# Patient Record
Sex: Male | Born: 1965 | Race: White | Hispanic: No | Marital: Married | Smoking: Never smoker
Health system: Southern US, Community
[De-identification: ages and names within clinical notes are randomized; demographics above are authoritative.]

## PROBLEM LIST (undated history)

## (undated) DIAGNOSIS — I10 Essential (primary) hypertension: Secondary | ICD-10-CM

## (undated) HISTORY — DX: Essential (primary) hypertension: I10

---

## 2014-12-09 DIAGNOSIS — I10 Essential (primary) hypertension: Secondary | ICD-10-CM | POA: Insufficient documentation

## 2014-12-09 DIAGNOSIS — S39012A Strain of muscle, fascia and tendon of lower back, initial encounter: Secondary | ICD-10-CM | POA: Insufficient documentation

## 2015-05-20 DIAGNOSIS — M25562 Pain in left knee: Secondary | ICD-10-CM | POA: Insufficient documentation

## 2016-02-01 DIAGNOSIS — Z8739 Personal history of other diseases of the musculoskeletal system and connective tissue: Secondary | ICD-10-CM | POA: Insufficient documentation

## 2017-04-19 ENCOUNTER — Encounter: Payer: Self-pay | Admitting: Family Medicine

## 2017-04-19 ENCOUNTER — Ambulatory Visit (INDEPENDENT_AMBULATORY_CARE_PROVIDER_SITE_OTHER): Payer: BC Managed Care – PPO

## 2017-04-19 ENCOUNTER — Ambulatory Visit: Payer: BC Managed Care – PPO | Admitting: Family Medicine

## 2017-04-19 VITALS — BP 126/80 | HR 75 | Temp 99.0°F | Ht 71.0 in | Wt 233.1 lb

## 2017-04-19 DIAGNOSIS — I10 Essential (primary) hypertension: Secondary | ICD-10-CM | POA: Diagnosis not present

## 2017-04-19 DIAGNOSIS — S86911A Strain of unspecified muscle(s) and tendon(s) at lower leg level, right leg, initial encounter: Secondary | ICD-10-CM | POA: Diagnosis not present

## 2017-04-19 MED ORDER — INDOMETHACIN 50 MG PO CAPS: 50.0000 mg | ORAL_CAPSULE | Freq: Three times a day (TID) | ORAL | 0 refills | Status: DC | PRN

## 2017-04-19 MED ORDER — AMLODIPINE BESYLATE 10 MG PO TABS: 10.0000 mg | ORAL_TABLET | Freq: Every day | ORAL | 0 refills | Status: DC

## 2017-04-19 MED ORDER — TELMISARTAN-HCTZ 80-25 MG PO TABS: 1.0000 | ORAL_TABLET | Freq: Every day | ORAL | 0 refills | Status: DC

## 2017-04-19 NOTE — Progress Notes (Addendum)
Subjective:  Patient ID: Russell Franco, male    DOB: Mar 28, 1966  Age: 51 y.o. MRN: [id number]  CC: Establish Care   HPI Russell PlanKeith D Purpura presents for establishment of care and evaluation of right knee pain.  One week ago he experienced deceleration injury involving his right knee.  Right foot planted and his body kept going over it.  He has no history of right knee problems.  Distant history of gout.  He used an old prescription of Indocin for relief.  X-rays taken of his left knee in the last year showed also showed mild arthritic changes.  There is swelling in the knee that has improved with compression.  He has had no problems with locking or giving way.  BP is well controlled on his current blood pressure regimen.  Labs taken back in September were reviewed and were normal.  History Mellody DanceKeith has a past medical history of Hypertension.   He has no past surgical history on file.   His family history is not on file.He reports that  has never smoked. he has never used smokeless tobacco. His alcohol and drug histories are not on file.  Outpatient Medications Prior to Visit  Medication Sig Dispense Refill  . amLODipine (NORVASC) 10 MG tablet Take 1 tablet by mouth daily.    Marland Kitchen. telmisartan-hydrochlorothiazide (MICARDIS HCT) 80-25 MG tablet Take 1 tablet by mouth daily.     No facility-administered medications prior to visit.     ROS Review of Systems  Constitutional: Negative.   Respiratory: Negative for chest tightness and shortness of breath.   Cardiovascular: Negative for chest pain and palpitations.  Gastrointestinal: Negative for abdominal pain, nausea and vomiting.  Endocrine: Negative for polyphagia and polyuria.  Musculoskeletal: Positive for arthralgias and gait problem.  Skin: Negative for rash and wound.  Neurological: Negative for weakness.  Hematological: Does not bruise/bleed easily.  Psychiatric/Behavioral: Negative.     Objective:  BP 126/80 (BP Location: Left  Arm, Patient Position: Sitting, Cuff Size: Normal)   Pulse 75   Temp 99 F (37.2 C) (Oral)   Ht 5\' 11"  (1.803 m)   Wt 233 lb 2 oz (105.7 kg)   SpO2 98%   BMI 32.51 kg/m   Physical Exam  Constitutional: He is oriented to person, place, and time. He appears well-developed and well-nourished. No distress.  HENT:  Head: Normocephalic and atraumatic.  Right Ear: External ear normal.  Left Ear: External ear normal.  Eyes: Conjunctivae are normal. Right eye exhibits no discharge. Left eye exhibits no discharge. No scleral icterus.  Musculoskeletal:       Right knee: He exhibits swelling. He exhibits normal range of motion and no effusion. Tenderness found. Lateral joint line tenderness noted.       Legs: Neurological: He is alert and oriented to person, place, and time.  Skin: Skin is warm and dry. He is not diaphoretic.  Psychiatric: He has a normal mood and affect. His behavior is normal.      Assessment & Plan:   Mellody DanceKeith was seen today for establish care.  Diagnoses and all orders for this visit:  Strain of right knee, initial encounter -     DG Knee Complete 4 Views Right; Future -     Ambulatory referral to Sports Medicine -     indomethacin (INDOCIN) 50 MG capsule; Take 1 capsule (50 mg total) by mouth 3 (three) times daily as needed. With food. -     DG Knee Complete 4  Views Right  Essential hypertension -     amLODipine (NORVASC) 10 MG tablet; Take 1 tablet (10 mg total) by mouth daily. -     telmisartan-hydrochlorothiazide (MICARDIS HCT) 80-25 MG tablet; Take 1 tablet by mouth daily.   I have changed Lucio EdwardKeith D. Nash's amLODipine. I am also having him start on indomethacin. Additionally, I am having him maintain his telmisartan-hydrochlorothiazide.  Meds ordered this encounter  Medications  . indomethacin (INDOCIN) 50 MG capsule    Sig: Take 1 capsule (50 mg total) by mouth 3 (three) times daily as needed. With food.    Dispense:  60 capsule    Refill:  0  .  amLODipine (NORVASC) 10 MG tablet    Sig: Take 1 tablet (10 mg total) by mouth daily.    Dispense:  90 tablet    Refill:  0  . telmisartan-hydrochlorothiazide (MICARDIS HCT) 80-25 MG tablet    Sig: Take 1 tablet by mouth daily.    Dispense:  90 tablet    Refill:  0     Follow-up: No Follow-up on file.  Mliss SaxWilliam Alfred Oceane Fosse, MD

## 2017-04-19 NOTE — Addendum Note (Signed)
Addended by: Andrez GrimeKREMER, Traniya Prichett A on: 04/19/2017 03:54 PM   Modules accepted: Orders

## 2017-04-26 ENCOUNTER — Ambulatory Visit: Payer: BC Managed Care – PPO | Admitting: Family Medicine

## 2017-05-03 ENCOUNTER — Ambulatory Visit: Payer: BC Managed Care – PPO | Admitting: Family Medicine

## 2017-05-03 ENCOUNTER — Encounter: Payer: Self-pay | Admitting: Family Medicine

## 2017-05-03 DIAGNOSIS — S86911D Strain of unspecified muscle(s) and tendon(s) at lower leg level, right leg, subsequent encounter: Secondary | ICD-10-CM

## 2017-05-03 NOTE — Assessment & Plan Note (Signed)
Knee exam was reassuring today. There is no effusion to suggest an intra-articular problem. Hamstring testing was strongly as well. Ultrasound was reassuring and only had suggestion of a possible plantaris strain - Counseled on rehabilitation exercises - Continue compression and ice as needed. - Can follow-up when necessary.

## 2017-05-03 NOTE — Progress Notes (Signed)
Russell Franco - 52 y.o. male MRN [id number]  Date of birth: 07/31/1965  SUBJECTIVE:  Including CC & ROS.  Chief Complaint  Patient presents with  . Right knee pain    Russell Franco is a 52 y.o. male that is presenting with right knee pain. Patient states the pain has been ongoing for two weeks. Patient lost his balance walking in the snow on 04/07/17. He did not fall, but caught himself. He has been applying ice and using compression with improvement. Ambulating triggers more pain. Pain was located on the lateral aspect of his knee. Has been working out and feeling no significant pain. Pain is minimal and localized. No effusion.   Independent review of the right knee x-rays from 04/19/17 show good joint space and some suggestion of chondrocalcinosis.   Review of Systems  Constitutional: Negative for fever.  Respiratory: Negative for shortness of breath.   Cardiovascular: Negative for chest pain.  Gastrointestinal: Negative for abdominal pain.  Musculoskeletal: Negative for arthralgias, gait problem and joint swelling.  Skin: Negative for color change.  Neurological: Negative for weakness and numbness.  Hematological: Negative for adenopathy.  Psychiatric/Behavioral: Negative for agitation.    HISTORY: Past Medical, Surgical, Social, and Family History Reviewed & Updated per EMR.   Pertinent Historical Findings include:  Past Medical History:  Diagnosis Date  . Hypertension     No past surgical history on file.  Allergies  Allergen Reactions  . Penicillin G Other (See Comments) and Rash    Hives    No family history on file.   Social History   Socioeconomic History  . Marital status: Single    Spouse name: Not on file  . Number of children: Not on file  . Years of education: Not on file  . Highest education level: Not on file  Social Needs  . Financial resource strain: Not on file  . Food insecurity - worry: Not on file  . Food insecurity - inability: Not on  file  . Transportation needs - medical: Not on file  . Transportation needs - non-medical: Not on file  Occupational History  . Not on file  Tobacco Use  . Smoking status: Never Smoker  . Smokeless tobacco: Never Used  Substance and Sexual Activity  . Alcohol use: Not on file  . Drug use: Not on file  . Sexual activity: Not on file  Other Topics Concern  . Not on file  Social History Narrative  . Not on file     PHYSICAL EXAM:  VS: BP 134/76 (BP Location: Left Arm, Patient Position: Sitting, Cuff Size: Normal)   Pulse 80   Temp 98.8 F (37.1 C) (Oral)   Ht 5\' 11"  (1.803 m)   Wt 233 lb (105.7 kg)   SpO2 97%   BMI 32.50 kg/m  Physical Exam Gen: NAD, alert, cooperative with exam, well-appearing ENT: normal lips, normal nasal mucosa,  Eye: normal EOM, normal conjunctiva and lids CV:  no edema, +2 pedal pulses   Resp: no accessory muscle use, non-labored,  GI: no masses or tenderness, no hernia  Skin: no rashes, no areas of induration  Neuro: normal tone, normal sensation to touch Psych:  normal insight, alert and oriented MSK:  Right knee: No obvious effusion. Normal flexion and extension. Normal and points valgus and varus stress testing. Normal gait. Normal strength with hamstring testing was foot inverted and even her to. No tenderness to palpation of the hamstring. No obvious Baker's cyst. Neurovascular intact.  Limited ultrasound: Right knee:  No obvious effusion. Normal-appearing lateral meniscus. Normal-appearing LCL. No Baker's cyst. Normal-appearing lateral gastrocnemius. Normal insertion of the biceps femoris.  Plantaris appears to have hypoechoic change to demonstrate a strain.   Summary: Findings likely suggest of a plantaris strain.  Ultrasound and interpretation by Clare Gandy, MD        ASSESSMENT & PLAN:   Strain of right knee Knee exam was reassuring today. There is no effusion to suggest an intra-articular problem. Hamstring  testing was strongly as well. Ultrasound was reassuring and only had suggestion of a possible plantaris strain - Counseled on rehabilitation exercises - Continue compression and ice as needed. - Can follow-up when necessary.

## 2017-07-18 ENCOUNTER — Ambulatory Visit: Payer: BC Managed Care – PPO | Admitting: Family Medicine

## 2017-07-18 ENCOUNTER — Encounter: Payer: Self-pay | Admitting: Family Medicine

## 2017-07-18 VITALS — BP 130/70 | HR 77 | Wt 245.4 lb

## 2017-07-18 DIAGNOSIS — E6609 Other obesity due to excess calories: Secondary | ICD-10-CM | POA: Insufficient documentation

## 2017-07-18 DIAGNOSIS — M7662 Achilles tendinitis, left leg: Secondary | ICD-10-CM | POA: Insufficient documentation

## 2017-07-18 DIAGNOSIS — S86911A Strain of unspecified muscle(s) and tendon(s) at lower leg level, right leg, initial encounter: Secondary | ICD-10-CM

## 2017-07-18 DIAGNOSIS — M1000 Idiopathic gout, unspecified site: Secondary | ICD-10-CM | POA: Diagnosis not present

## 2017-07-18 DIAGNOSIS — Z6834 Body mass index (BMI) 34.0-34.9, adult: Secondary | ICD-10-CM | POA: Diagnosis not present

## 2017-07-18 DIAGNOSIS — Z0001 Encounter for general adult medical examination with abnormal findings: Secondary | ICD-10-CM | POA: Diagnosis not present

## 2017-07-18 DIAGNOSIS — I10 Essential (primary) hypertension: Secondary | ICD-10-CM | POA: Diagnosis not present

## 2017-07-18 DIAGNOSIS — Z125 Encounter for screening for malignant neoplasm of prostate: Secondary | ICD-10-CM | POA: Diagnosis not present

## 2017-07-18 MED ORDER — TELMISARTAN-HCTZ 80-25 MG PO TABS: 1.0000 | ORAL_TABLET | Freq: Every day | ORAL | 1 refills | Status: DC

## 2017-07-18 MED ORDER — INDOMETHACIN 50 MG PO CAPS: 50.0000 mg | ORAL_CAPSULE | Freq: Three times a day (TID) | ORAL | 0 refills | Status: DC | PRN

## 2017-07-18 MED ORDER — AMLODIPINE BESYLATE 10 MG PO TABS: 10.0000 mg | ORAL_TABLET | Freq: Every day | ORAL | 1 refills | Status: DC

## 2017-07-18 MED ORDER — COLCHICINE 0.6 MG PO TABS: 0.6000 mg | ORAL_TABLET | Freq: Two times a day (BID) | ORAL | 2 refills | Status: DC

## 2017-07-18 MED ORDER — LORCASERIN HCL 10 MG PO TABS: ORAL_TABLET | ORAL | 2 refills | Status: DC

## 2017-07-18 NOTE — Patient Instructions (Signed)
BMI for Adults Body mass index (BMI) is a number that is calculated from a person's weight and height. In most adults, the number is used to find how much of an adult's weight is made up of fat. BMI is not as accurate as a direct measure of body fat. How is BMI calculated? BMI is calculated by dividing weight in kilograms by height in meters squared. It can also be calculated by dividing weight in pounds by height in inches squared, then multiplying the resulting number by 703. Charts are available to help you find your BMI quickly and easily without doing this calculation. How is BMI interpreted? Health care professionals use BMI charts to identify whether an adult is underweight, at a normal weight, or overweight based on the following guidelines:  Underweight: BMI less than 18.5.  Normal weight: BMI between 18.5 and 24.9.  Overweight: BMI between 25 and 29.9.  Obese: BMI of 30 and above.  BMI is usually interpreted the same for males and females. Weight includes both fat and muscle, so someone with a muscular build, such as an athlete, may have a BMI that is higher than 24.9. In cases like these, BMI may not accurately depict body fat. To determine if excess body fat is the cause of a BMI of 25 or higher, further assessments may need to be done by a health care provider. Why is BMI a useful tool? BMI is used to identify a possible weight problem that may be related to a medical problem or may increase the risk for medical problems. BMI can also be used to promote changes to reach a healthy weight. This information is not intended to replace advice given to you by your health care provider. Make sure you discuss any questions you have with your health care provider. Document Released: 12/27/2003 Document Revised: 08/25/2015 Document Reviewed: 09/11/2013 Elsevier Interactive Patient Education  2018 [employer].  Achilles Tendinitis Rehab Ask your health care provider which exercises are  safe for you. Do exercises exactly as told by your health care provider and adjust them as directed. It is normal to feel mild stretching, pulling, tightness, or discomfort as you do these exercises, but you should stop right away if you feel sudden pain or your pain gets worse. Do not begin these exercises until told by your health care provider. Stretching and range of motion exercises These exercises warm up your muscles and joints and improve the movement and flexibility of your ankle. These exercises also help to relieve pain, numbness, and tingling. Exercise A: Standing wall calf stretch, knee straight  1. Stand with your hands against a wall. 2. Extend your __________ leg behind you and bend your front knee slightly. Keep both of your heels on the floor. 3. Point the toes of your back foot slightly inward. 4. Keeping your heels on the floor and your back knee straight, shift your weight toward the wall. Do not allow your back to arch. You should feel a gentle stretch in your calf. 5. Hold this position for seconds. Repeat __________ times. Complete this stretch __________ times per day. Exercise B: Standing wall calf stretch, knee bent 1. Stand with your hands against a wall. 2. Extend your __________ leg behind you, and bend your front knee slightly. Keep both of your heels on the floor. 3. Point the toes of your back foot slightly inward. 4. Keeping your heels on the floor, unlock your back knee so that it is bent. You should feel a  gentle stretch deep in your calf. 5. Hold this position for __________ seconds. Repeat __________ times. Complete this stretch __________ times per day. Strengthening exercises These exercises build strength and control of your ankle. Endurance is the ability to use your muscles for a long time, even after they get tired. Exercise C: Plantar flexion with band  1. Sit on the floor with your __________ leg extended. You may put a pillow under your calf to  give your foot more room to move. 2. Loop a rubber exercise band or tube around the ball of your __________ foot. The ball of your foot is on the walking surface, right under your toes. The band or tube should be slightly tense when your foot is relaxed. If the band or tube slips, you can put on your shoe or put a washcloth between the band and your foot to help it stay in place. 3. Slowly point your toes downward, pushing them away from you. 4. Hold this position for __________ seconds. 5. Slowly release the tension in the band or tube, controlling smoothly until your foot is back to the starting position. Repeat __________ times. Complete this exercise __________ times per day. Exercise D: Heel raise with eccentric lower  1. Stand on a step with the balls of your feet. The ball of your foot is on the walking surface, right under your toes. ? Do not put your heels on the step. ? For balance, rest your hands on the wall or on a railing. 2. Rise up onto the balls of your feet. 3. Keeping your heels up, shift all of your weight to your __________ leg and pick up your other leg. 4. Slowly lower your __________ leg so your heel drops below the level of the step. 5. Put down your foot. If told by your health care provider, build up to:  3 sets of 15 repetitions while keeping your knees straight.  3 sets of 15 repetitions while keeping your knees bent as far as told by your health care provider.  Complete this exercise __________ times per day. If this exercise is too easy, try doing it while wearing a backpack with weights in it. Balance exercises These exercises improve or maintain your balance. Balance is important in preventing falls. Exercise E: Single leg stand 1. Without shoes, stand near a railing or in a door frame. Hold on to the railing or door frame as needed. 2. Stand on your __________ foot. Keep your big toe down on the floor and try to keep your arch lifted. 3. Hold this  position for __________ seconds. Repeat __________ times. Complete this exercise __________ times per day. If this exercise is too easy, you can try it with your eyes closed or while standing on a pillow. This information is not intended to replace advice given to you by your health care provider. Make sure you discuss any questions you have with your health care provider. Document Released: 11/15/2004 Document Revised: 12/22/2015 Document Reviewed: 12/21/2014 Elsevier Interactive Patient Education  2018 [employer]  Achilles Tendinitis Achilles tendinitis is inflammation of the tough, cord-like band that attaches the lower leg muscles to the heel bone (Achilles tendon). This is usually caused by overusing the tendon and the ankle joint. Achilles tendinitis usually gets better over time with treatment and caring for yourself at home. It can take weeks or months to heal completely. What are the causes? This condition may be caused by:  A sudden increase in exercise or activity,  such as running.  Doing the same exercises or activities (such as jumping) over and over.  Not warming up calf muscles before exercising.  Exercising in shoes that are worn out or not made for exercise.  Having arthritis or a bone growth (spur) on the back of the heel bone. This can rub against the tendon and hurt it.  Age-related wear and tear. Tendons become less flexible with age and more likely to be injured.  What are the signs or symptoms? Common symptoms of this condition include:  Pain in the Achilles tendon or in the back of the leg, just above the heel. The pain usually gets worse with exercise.  Stiffness or soreness in the back of the leg, especially in the morning.  Swelling of the skin over the Achilles tendon.  Thickening of the tendon.  Bone spurs at the bottom of the Achilles tendon, near the heel.  Trouble standing on tiptoe.  How is this diagnosed? This condition is diagnosed based  on your symptoms and a physical exam. You may have tests, including:  X-rays.  MRI.  How is this treated? The goal of treatment is to relieve symptoms and help your injury heal. Treatment may include:  Decreasing or stopping activities that caused the tendinitis. This may mean switching to low-impact exercises like biking or swimming.  Icing the injured area.  Doing physical therapy, including strengthening and stretching exercises.  NSAIDs to help relieve pain and swelling.  Using supportive shoes, wraps, heel lifts, or a walking boot (air cast).  Surgery. This may be done if your symptoms do not improve after 6 months.  Using high-energy shock wave impulses to stimulate the healing process (extracorporeal shock wave therapy). This is rare.  Injection of medicines to help relieve inflammation (corticosteroids). This is rare.  Follow these instructions at home: If you have an air cast:  Wear the cast as told by your health care provider. Remove it only as told by your health care provider.  Loosen the cast if your toes tingle, become numb, or turn cold and blue. Activity  Gradually return to your normal activities once your health care provider approves. Do not do activities that cause pain. ? Consider doing low-impact exercises, like cycling or swimming.  If you have an air cast, ask your health care provider when it is safe for you to drive.  If physical therapy was prescribed, do exercises as told by your health care provider or physical therapist. Managing pain, stiffness, and swelling  Raise (elevate) your foot above the level of your heart while you are sitting or lying down.  Move your toes often to avoid stiffness and to lessen swelling.  If directed, put ice on the injured area: ? Put ice in a plastic bag. ? Place a towel between your skin and the bag. ? Leave the ice on for 20 minutes, 2-3 times a day General instructions  If directed, wrap your foot with  an elastic bandage or other wrap. This can help keep your tendon from moving too much while it heals. Your health care provider will show you how to wrap your foot correctly.  Wear supportive shoes or heel lifts only as told by your health care provider.  Take over-the-counter and prescription medicines only as told by your health care provider.  Keep all follow-up visits as told by your health care provider. This is important. Contact a health care provider if:  You have symptoms that gets worse.  You have  pain that does not get better with medicine.  You develop new, unexplained symptoms.  You develop warmth and swelling in your foot.  You have a fever. Get help right away if:  You have a sudden popping sound or sensation in your Achilles tendon followed by severe pain.  You cannot move your toes or foot.  You cannot put any weight on your foot. Summary  Achilles tendinitis is inflammation of the tough, cord-like band that attaches the lower leg muscles to the heel bone (Achilles tendon).  This condition is usually caused by overusing the tendon and the ankle joint. It can also be caused by arthritis or normal aging.  The most common symptoms of this condition include pain, swelling, or stiffness in the Achilles tendon or in the back of the leg.  This condition is usually treated with rest, NSAIDs, and physical therapy. This information is not intended to replace advice given to you by your health care provider. Make sure you discuss any questions you have with your health care provider. Document Released: 01/24/2005 Document Revised: 03/05/2016 Document Reviewed: 03/05/2016 Elsevier Interactive Patient Education  2017 [employer]  Gout Gout is painful swelling that can occur in some of your joints. Gout is a type of arthritis. This condition is caused by having too much uric acid in your body. Uric acid is a chemical that forms when your body breaks down substances called  purines. Purines are important for building body proteins. When your body has too much uric acid, sharp crystals can form and build up inside your joints. This causes pain and swelling. Gout attacks can happen quickly and be very painful (acute gout). Over time, the attacks can affect more joints and become more frequent (chronic gout). Gout can also cause uric acid to build up under your skin and inside your kidneys. What are the causes? This condition is caused by too much uric acid in your blood. This can occur because:  Your kidneys do not remove enough uric acid from your blood. This is the most common cause.  Your body makes too much uric acid. This can occur with some cancers and cancer treatments. It can also occur if your body is breaking down too many red blood cells (hemolytic anemia).  You eat too many foods that are high in purines. These foods include organ meats and some seafood. Alcohol, especially beer, is also high in purines.  A gout attack may be triggered by trauma or stress. What increases the risk? This condition is more likely to develop in people who:  Have a family history of gout.  Are male and middle-aged.  Are male and have gone through menopause.  Are obese.  Frequently drink alcohol, especially beer.  Are dehydrated.  Lose weight too quickly.  Have an organ transplant.  Have lead poisoning.  Take certain medicines, including aspirin, cyclosporine, diuretics, levodopa, and niacin.  Have kidney disease or psoriasis.  What are the signs or symptoms? An attack of acute gout happens quickly. It usually occurs in just one joint. The most common place is the big toe. Attacks often start at night. Other joints that may be affected include joints of the feet, ankle, knee, fingers, wrist, or elbow. Symptoms may include:  Severe pain.  Warmth.  Swelling.  Stiffness.  Tenderness. The affected joint may be very painful to touch.  Shiny, red, or  purple skin.  Chills and fever.  Chronic gout may cause symptoms more frequently. More joints may be  involved. You may also have white or yellow lumps (tophi) on your hands or feet or in other areas near your joints. How is this diagnosed? This condition is diagnosed based on your symptoms, medical history, and physical exam. You may have tests, such as:  Blood tests to measure uric acid levels.  Removal of joint fluid with a needle (aspiration) to look for uric acid crystals.  X-rays to look for joint damage.  How is this treated? Treatment for this condition has two phases: treating an acute attack and preventing future attacks. Acute gout treatment may include medicines to reduce pain and swelling, including:  NSAIDs.  Steroids. These are strong anti-inflammatory medicines that can be taken by mouth (orally) or injected into a joint.  Colchicine. This medicine relieves pain and swelling when it is taken soon after an attack. It can be given orally or through an IV tube.  Preventive treatment may include:  Daily use of smaller doses of NSAIDs or colchicine.  Use of a medicine that reduces uric acid levels in your blood.  Changes to your diet. You may need to see a specialist about healthy eating (dietitian).  Follow these instructions at home: During a Gout Attack  If directed, apply ice to the affected area: ? Put ice in a plastic bag. ? Place a towel between your skin and the bag. ? Leave the ice on for 20 minutes, 2-3 times a day.  Rest the joint as much as possible. If the affected joint is in your leg, you may be given crutches to use.  Raise (elevate) the affected joint above the level of your heart as often as possible.  Drink enough fluids to keep your urine clear or pale yellow.  Take over-the-counter and prescription medicines only as told by your health care provider.  Do not drive or operate heavy machinery while taking prescription pain  medicine.  Follow instructions from your health care provider about eating or drinking restrictions.  Return to your normal activities as told by your health care provider. Ask your health care provider what activities are safe for you. Avoiding Future Gout Attacks  Follow a low-purine diet as told by your dietitian or health care provider. Avoid foods and drinks that are high in purines, including liver, kidney, anchovies, asparagus, herring, mushrooms, mussels, and beer.  Limit alcohol intake to no more than 1 drink a day for nonpregnant women and 2 drinks a day for men. One drink equals 12 oz of beer, 5 oz of wine, or 1 oz of hard liquor.  Maintain a healthy weight or lose weight if you are overweight. If you want to lose weight, talk with your health care provider. It is important that you do not lose weight too quickly.  Start or maintain an exercise program as told by your health care provider.  Drink enough fluids to keep your urine clear or pale yellow.  Take over-the-counter and prescription medicines only as told by your health care provider.  Keep all follow-up visits as told by your health care provider. This is important. Contact a health care provider if:  You have another gout attack.  You continue to have symptoms of a gout attack after10 days of treatment.  You have side effects from your medicines.  You have chills or a fever.  You have burning pain when you urinate.  You have pain in your lower back or belly. Get help right away if:  You have severe or uncontrolled pain.  You cannot urinate. This information is not intended to replace advice given to you by your health care provider. Make sure you discuss any questions you have with your health care provider. Document Released: 04/13/2000 Document Revised: 09/22/2015 Document Reviewed: 01/27/2015 Elsevier Interactive Patient Education  2018 [employer]. Low-Purine Diet Purines are compounds that affect  the level of uric acid in your body. A low-purine diet is a diet that is low in purines. Eating a low-purine diet can prevent the level of uric acid in your body from getting too high and causing gout or kidney stones or both. What do I need to know about this diet?  Choose low-purine foods. Examples of low-purine foods are listed in the next section.  Drink plenty of fluids, especially water. Fluids can help remove uric acid from your body. Try to drink 8-16 cups (1.9-3.8 L) a day.  Limit foods high in fat, especially saturated fat, as fat makes it harder for the body to get rid of uric acid. Foods high in saturated fat include pizza, cheese, ice cream, whole milk, fried foods, and gravies. Choose foods that are lower in fat and lean sources of protein. Use olive oil when cooking as it contains healthy fats that are not high in saturated fat.  Limit alcohol. Alcohol interferes with the elimination of uric acid from your body. If you are having a gout attack, avoid all alcohol.  Keep in mind that different people's bodies react differently to different foods. You will probably learn over time which foods do or do not affect you. If you discover that a food tends to cause your gout to flare up, avoid eating that food. You can more freely enjoy foods that do not cause problems. If you have any questions about a food item, talk to your dietitian or health care provider. Which foods are low, moderate, and high in purines? The following is a list of foods that are low, moderate, and high in purines. You can eat any amount of the foods that are low in purines. You may be able to have small amounts of foods that are moderate in purines. Ask your health care provider how much of a food moderate in purines you can have. Avoid foods high in purines. Grains  Foods low in purines: Enriched white bread, pasta, rice, cake, cornbread, popcorn.  Foods moderate in purines: Whole-grain breads and cereals, wheat  germ, bran, oatmeal. Uncooked oatmeal. Dry wheat bran or wheat germ.  Foods high in purines: Pancakes, [place] toast, biscuits, muffins. Vegetables  Foods low in purines: All vegetables, except those that are moderate in purines.  Foods moderate in purines: Asparagus, cauliflower, spinach, mushrooms, green peas. Fruits  All fruits are low in purines. Meats and other Protein Foods  Foods low in purines: Eggs, nuts, peanut butter.  Foods moderate in purines: 80-90% lean beef, lamb, veal, pork, poultry, fish, eggs, peanut butter, nuts. Crab, lobster, oysters, and shrimp. Cooked dried beans, peas, and lentils.  Foods high in purines: Anchovies, sardines, herring, mussels, tuna, codfish, scallops, trout, and haddock. Tomasa Blase. Organ meats (such as liver or kidney). Tripe. Game meat. Goose. Sweetbreads. Dairy  All dairy foods are low in purines. Low-fat and fat-free dairy products are best because they are low in saturated fat. Beverages  Drinks low in purines: Water, carbonated beverages, tea, coffee, cocoa.  Drinks moderate in purines: Soft drinks and other drinks sweetened with high-fructose corn syrup. Juices. To find whether a food or drink is sweetened with high-fructose  corn syrup, look at the ingredients list.  Drinks high in purines: Alcoholic beverages (such as beer). Condiments  Foods low in purines: Salt, herbs, olives, pickles, relishes, vinegar.  Foods moderate in purines: Butter, margarine, oils, mayonnaise. Fats and Oils  Foods low in purines: All types, except gravies and sauces made with meat.  Foods high in purines: Gravies and sauces made with meat. Other Foods  Foods low in purines: Sugars, sweets, gelatin. Cake. Soups made without meat.  Foods moderate in purines: Meat-based or fish-based soups, broths, or bouillons. Foods and drinks sweetened with high-fructose corn syrup.  Foods high in purines: High-fat desserts (such as ice cream, cookies, cakes, pies,  doughnuts, and chocolate). Contact your dietitian for more information on foods that are not listed here. This information is not intended to replace advice given to you by your health care provider. Make sure you discuss any questions you have with your health care provider. Document Released: 08/11/2010 Document Revised: 09/22/2015 Document Reviewed: 03/23/2013 Elsevier Interactive Patient Education  2017 [employer]  Obesity, Adult Obesity is the condition of having too much total body fat. Being overweight or obese means that your weight is greater than what is considered healthy for your body size. Obesity is determined by a measurement called BMI. BMI is an estimate of body fat and is calculated from height and weight. For adults, a BMI of 30 or higher is considered obese. Obesity can eventually lead to other health concerns and major illnesses, including:  Stroke.  Coronary artery disease (CAD).  Type 2 diabetes.  Some types of cancer, including cancers of the colon, breast, uterus, and gallbladder.  Osteoarthritis.  High blood pressure (hypertension).  High cholesterol.  Sleep apnea.  Gallbladder stones.  Infertility problems.  What are the causes? The main cause of obesity is taking in (consuming) more calories than your body uses for energy. Other factors that contribute to this condition may include:  Being born with genes that make you more likely to become obese.  Having a medical condition that causes obesity. These conditions include: ? Hypothyroidism. ? Polycystic ovarian syndrome (PCOS). ? Binge-eating disorder. ? Cushing syndrome.  Taking certain medicines, such as steroids, antidepressants, and seizure medicines.  Not being physically active (sedentary lifestyle).  Living where there are limited places to exercise safely or buy healthy foods.  Not getting enough sleep.  What increases the risk? The following factors may increase your risk of  this condition:  Having a family history of obesity.  Being a woman of African-American descent.  Being a man of Hispanic descent.  What are the signs or symptoms? Having excessive body fat is the main symptom of this condition. How is this diagnosed? This condition may be diagnosed based on:  Your symptoms.  Your medical history.  A physical exam. Your health care provider may measure: ? Your BMI. If you are an adult with a BMI between 25 and less than 30, you are considered overweight. If you are an adult with a BMI of 30 or higher, you are considered obese. ? The distances around your hips and your waist (circumferences). These may be compared to each other to help diagnose your condition. ? Your skinfold thickness. Your health care provider may gently pinch a fold of your skin and measure it.  How is this treated? Treatment for this condition often includes changing your lifestyle. Treatment may include some or all of the following:  Dietary changes. Work with your health care provider and a  dietitian to set a weight-loss goal that is healthy and reasonable for you. Dietary changes may include eating: ? Smaller portions. A portion size is the amount of a particular food that is healthy for you to eat at one time. This varies from person to person. ? Low-calorie or low-fat options. ? More whole grains, fruits, and vegetables.  Regular physical activity. This may include aerobic activity (cardio) and strength training.  Medicine to help you lose weight. Your health care provider may prescribe medicine if you are unable to lose 1 pound a week after 6 weeks of eating more healthily and doing more physical activity.  Surgery. Surgical options may include gastric banding and gastric bypass. Surgery may be done if: ? Other treatments have not helped to improve your condition. ? You have a BMI of 40 or higher. ? You have life-threatening health problems related to obesity.  Follow  these instructions at home:  Eating and drinking   Follow recommendations from your health care provider about what you eat and drink. Your health care provider may advise you to: ? Limit fast foods, sweets, and processed snack foods. ? Choose low-fat options, such as low-fat milk instead of whole milk. ? Eat 5 or more servings of fruits or vegetables every day. ? Eat at home more often. This gives you more control over what you eat. ? Choose healthy foods when you eat out. ? Learn what a healthy portion size is. ? Keep low-fat snacks on hand. ? Avoid sugary drinks, such as soda, fruit juice, iced tea sweetened with sugar, and flavored milk. ? Eat a healthy breakfast.  Drink enough water to keep your urine clear or pale yellow.  Do not go without eating for long periods of time (do not fast) or follow a fad diet. Fasting and fad diets can be unhealthy and even dangerous. Physical Activity  Exercise regularly, as told by your health care provider. Ask your health care provider what types of exercise are safe for you and how often you should exercise.  Warm up and stretch before being active.  Cool down and stretch after being active.  Rest between periods of activity. Lifestyle  Limit the time that you spend in front of your TV, computer, or video game system.  Find ways to reward yourself that do not involve food.  Limit alcohol intake to no more than 1 drink a day for nonpregnant women and 2 drinks a day for men. One drink equals 12 oz of beer, 5 oz of wine, or 1 oz of hard liquor. General instructions  Keep a weight loss journal to keep track of the food you eat and how much you exercise you get.  Take over-the-counter and prescription medicines only as told by your health care provider.  Take vitamins and supplements only as told by your health care provider.  Consider joining a support group. Your health care provider may be able to recommend a support group.  Keep  all follow-up visits as told by your health care provider. This is important. Contact a health care provider if:  You are unable to meet your weight loss goal after 6 weeks of dietary and lifestyle changes. This information is not intended to replace advice given to you by your health care provider. Make sure you discuss any questions you have with your health care provider. Document Released: 05/24/2004 Document Revised: 09/19/2015 Document Reviewed: 02/02/2015 Elsevier Interactive Patient Education  2018 [employer].

## 2017-07-18 NOTE — Progress Notes (Signed)
Subjective:  Patient ID: Russell Franco, male    DOB: Feb 21, 1966  Age: 52 y.o. MRN: [id number]  CC: Follow-up   HPI Russell Franco presents for follow-up of his hypertension and gout.  Blood pressures been well controlled on his current regimen and he is having no problems with the medicines.  They recently changed him to a different generic amlodipine.  He is well controlled with as needed colchicine and Indocin.  He has no more than one attack in a given year.  He does have a history of an elevated uric acid.  He feels as though it is time to do something about his weight and I agree.  He realizes now that he is only obese class.  He wants to do something about it.  He requests nutrition counseling and I agree.  He asked about possible medication assistance.  He does not smoke does not drink alcohol.  He does not use illicit drugs.  He is married and lives with his wife.  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  He lives in [place].  He has been having some discomfort in his posterior left ankle.  No injury.  It is worse after exercising his ca his knee is improved.lves.  His urine stream is strong.  He had a colonoscopy at age 50 and was advised to return in 10 years. Outpatient Medications Prior to Visit  Medication Sig Dispense Refill  . amLODipine (NORVASC) 10 MG tablet Take 1 tablet (10 mg total) by mouth daily. 90 tablet 0  . colchicine 0.6 MG tablet Take 0.6 mg by mouth daily.    . indomethacin (INDOCIN) 50 MG capsule Take 1 capsule (50 mg total) by mouth 3 (three) times daily as needed. With food. 60 capsule 0  . telmisartan-hydrochlorothiazide (MICARDIS HCT) 80-25 MG tablet Take 1 tablet by mouth daily. 90 tablet 0   No facility-administered medications prior to visit.     ROS Review of Systems  Constitutional: Negative for chills, fatigue, fever and unexpected weight change.  HENT: Negative.   Eyes: Negative.   Respiratory: Negative.   Cardiovascular: Negative.   Gastrointestinal:  Negative.   Endocrine: Negative for polyphagia and polyuria.  Genitourinary: Negative for difficulty urinating, hematuria and urgency.  Musculoskeletal: Negative.   Skin: Negative for color change and rash.  Allergic/Immunologic: Negative for immunocompromised state.  Neurological: Negative for headaches.  Hematological: Does not bruise/bleed easily.  Psychiatric/Behavioral: Negative.     Objective:  BP 130/70 (BP Location: Left Arm, Patient Position: Sitting, Cuff Size: Large)   Pulse 77   Wt 245 lb 6.4 oz (111.3 kg)   BMI 34.23 kg/m   BP Readings from Last 3 Encounters:  07/18/17 130/70  05/03/17 134/76  04/19/17 126/80    Wt Readings from Last 3 Encounters:  07/18/17 245 lb 6.4 oz (111.3 kg)  05/03/17 233 lb (105.7 kg)  04/19/17 233 lb 2 oz (105.7 kg)    Physical Exam  Constitutional: He is oriented to person, place, and time. He appears well-developed and well-nourished. No distress.  HENT:  Head: Normocephalic and atraumatic.  Right Ear: External ear normal.  Left Ear: External ear normal.  Mouth/Throat: Oropharynx is clear and moist. No oropharyngeal exudate.  Eyes: Pupils are equal, round, and reactive to light. Conjunctivae are normal. Right eye exhibits no discharge. Left eye exhibits no discharge. No scleral icterus.  Neck: Neck supple. No JVD present. No tracheal deviation present. No thyromegaly present.  Cardiovascular: Normal rate, regular rhythm  and normal heart sounds.  Pulmonary/Chest: Effort normal and breath sounds normal. No stridor. No respiratory distress. He has no wheezes. He has no rales.  Abdominal: Soft. Bowel sounds are normal. He exhibits no distension. There is no tenderness. There is no rebound and no guarding.  Genitourinary: Rectal exam shows no external hemorrhoid, no internal hemorrhoid, no fissure, no mass, no tenderness, anal tone normal and guaiac negative stool. Prostate is not enlarged and not tender.  Lymphadenopathy:    He has no  cervical adenopathy.  Neurological: He is alert and oriented to person, place, and time.  Skin: Skin is warm and dry. No rash noted. He is not diaphoretic. No erythema.  Psychiatric: He has a normal mood and affect. His behavior is normal.    No results found for: WBC, HGB, HCT, PLT, GLUCOSE, CHOL, TRIG, HDL, LDLDIRECT, LDLCALC, ALT, AST, NA, K, CL, CREATININE, BUN, CO2, TSH, PSA, INR, GLUF, HGBA1C, MICROALBUR  Patient was never admitted.  Assessment & Plan:   Russell Franco was seen today for follow-up.  Diagnoses and all orders for this visit:  Essential hypertension -     amLODipine (NORVASC) 10 MG tablet; Take 1 tablet (10 mg total) by mouth daily. -     telmisartan-hydrochlorothiazide (MICARDIS HCT) 80-25 MG tablet; Take 1 tablet by mouth daily. -     CBC -     Comprehensive metabolic panel -     TSH -     Urinalysis, Routine w reflex microscopic  Achilles bursitis of left lower extremity  Acute idiopathic gout, unspecified site -     colchicine 0.6 MG tablet; Take 1 tablet (0.6 mg total) by mouth 2 (two) times daily. As needed for attacks -     indomethacin (INDOCIN) 50 MG capsule; Take 1 capsule (50 mg total) by mouth 3 (three) times daily as needed. With food. -     Uric acid  Morbid obesity (HCC) -     Amb ref to Medical Nutrition Therapy-MNT -     TSH  Class 1 obesity due to excess calories without serious comorbidity with body mass index (BMI) of 34.0 to 34.9 in adult -     Lorcaserin HCl 10 MG TABS; One twice each day. -     Lipid panel -     TSH  Strain of right knee, initial encounter -     indomethacin (INDOCIN) 50 MG capsule; Take 1 capsule (50 mg total) by mouth 3 (three) times daily as needed. With food.  Encounter for health maintenance examination with abnormal findings -     Lipid panel -     PSA -     Urinalysis, Routine w reflex microscopic   I have changed Russell Franco D. Franco's colchicine. I am also having him start on Lorcaserin HCl. Additionally, I am  having him maintain his amLODipine, indomethacin, and telmisartan-hydrochlorothiazide.   Patient agrees to nutrition counseling.  With he will try Belviq.  Suggest follow-up in 3 months to see how he is doing with his weight loss endeavor.  Blood work is been stable in the past.  Meds ordered this encounter  Medications  . amLODipine (NORVASC) 10 MG tablet    Sig: Take 1 tablet (10 mg total) by mouth daily.    Dispense:  90 tablet    Refill:  1  . colchicine 0.6 MG tablet    Sig: Take 1 tablet (0.6 mg total) by mouth 2 (two) times daily. As needed for attacks    Dispense:  60 tablet    Refill:  2  . indomethacin (INDOCIN) 50 MG capsule    Sig: Take 1 capsule (50 mg total) by mouth 3 (three) times daily as needed. With food.    Dispense:  60 capsule    Refill:  0  . telmisartan-hydrochlorothiazide (MICARDIS HCT) 80-25 MG tablet    Sig: Take 1 tablet by mouth daily.    Dispense:  90 tablet    Refill:  1  . Lorcaserin HCl 10 MG TABS    Sig: One twice each day.    Dispense:  60 tablet    Refill:  2     Follow-up: Return in about 3 months (around 10/18/2017).  [name], MD

## 2017-07-19 ENCOUNTER — Telehealth: Payer: Self-pay | Admitting: Family Medicine

## 2017-07-19 LAB — CBC
HEMATOCRIT: 42.2 % (ref 39.0–52.0)
HEMOGLOBIN: 14.1 g/dL (ref 13.0–17.0)
MCHC: 33.3 g/dL (ref 30.0–36.0)
MCV: 95.7 fl (ref 78.0–100.0)
Platelets: 346 10*3/uL (ref 150.0–400.0)
RBC: 4.41 Mil/uL (ref 4.22–5.81)
RDW: 15.4 % (ref 11.5–15.5)
WBC: 7.9 10*3/uL (ref 4.0–10.5)

## 2017-07-19 LAB — COMPREHENSIVE METABOLIC PANEL
ALT: 20 U/L (ref 0–53)
AST: 19 U/L (ref 0–37)
Albumin: 4.4 g/dL (ref 3.5–5.2)
Alkaline Phosphatase: 79 U/L (ref 39–117)
BUN: 19 mg/dL (ref 6–23)
CHLORIDE: 97 meq/L (ref 96–112)
CO2: 26 meq/L (ref 19–32)
Calcium: 9.7 mg/dL (ref 8.4–10.5)
Creatinine, Ser: 1.13 mg/dL (ref 0.40–1.50)
GFR: 72.55 mL/min (ref >60.00)
GLUCOSE: 106 mg/dL — AB (ref 70–99)
POTASSIUM: 3.8 meq/L (ref 3.5–5.1)
Sodium: 139 mEq/L (ref 135–145)
Total Bilirubin: 0.5 mg/dL (ref 0.2–1.2)
Total Protein: 8.1 g/dL (ref 6.0–8.3)

## 2017-07-19 LAB — PSA: PSA: 0.31 ng/mL (ref 0.10–4.00)

## 2017-07-19 LAB — URINALYSIS, ROUTINE W REFLEX MICROSCOPIC
Bilirubin Urine: NEGATIVE
Ketones, ur: NEGATIVE
Leukocytes, UA: NEGATIVE
Nitrite: NEGATIVE
SPECIFIC GRAVITY, URINE: 1.015 (ref 1.000–1.030)
Total Protein, Urine: NEGATIVE
Urine Glucose: NEGATIVE
Urobilinogen, UA: 0.2 (ref 0.0–1.0)
WBC UA: NONE SEEN (ref 0–?)
pH: 6 (ref 5.0–8.0)

## 2017-07-19 LAB — LIPID PANEL
CHOL/HDL RATIO: 3
Cholesterol: 165 mg/dL (ref 0–200)
HDL: 51.3 mg/dL (ref >39.00)
LDL CALC: 96 mg/dL (ref 0–99)
NONHDL: 113.28
Triglycerides: 86 mg/dL (ref 0.0–149.0)
VLDL: 17.2 mg/dL (ref 0.0–40.0)

## 2017-07-19 LAB — TSH: TSH: 2.31 u[IU]/mL (ref 0.35–4.50)

## 2017-07-19 LAB — URIC ACID: URIC ACID, SERUM: 11.1 mg/dL — AB (ref 4.0–7.8)

## 2017-07-19 NOTE — Telephone Encounter (Signed)
PA approved through 10/19/17. Approval faxed back to pharmacy.

## 2017-07-19 NOTE — Telephone Encounter (Signed)
Received PA for Belviq. PA submitted via covermymeds. Key: [id number]

## 2017-10-18 ENCOUNTER — Ambulatory Visit: Payer: BC Managed Care – PPO | Admitting: Family Medicine

## 2017-10-24 ENCOUNTER — Encounter: Payer: Self-pay | Admitting: Family Medicine

## 2017-10-24 ENCOUNTER — Ambulatory Visit: Payer: BC Managed Care – PPO | Admitting: Family Medicine

## 2017-10-24 VITALS — BP 134/80 | HR 82 | Ht 71.0 in | Wt 237.1 lb

## 2017-10-24 DIAGNOSIS — S86911A Strain of unspecified muscle(s) and tendon(s) at lower leg level, right leg, initial encounter: Secondary | ICD-10-CM | POA: Diagnosis not present

## 2017-10-24 DIAGNOSIS — M1000 Idiopathic gout, unspecified site: Secondary | ICD-10-CM | POA: Diagnosis not present

## 2017-10-24 DIAGNOSIS — Z6834 Body mass index (BMI) 34.0-34.9, adult: Secondary | ICD-10-CM | POA: Diagnosis not present

## 2017-10-24 DIAGNOSIS — A6920 Lyme disease, unspecified: Secondary | ICD-10-CM

## 2017-10-24 DIAGNOSIS — E6609 Other obesity due to excess calories: Secondary | ICD-10-CM | POA: Diagnosis not present

## 2017-10-24 DIAGNOSIS — I10 Essential (primary) hypertension: Secondary | ICD-10-CM | POA: Diagnosis not present

## 2017-10-24 MED ORDER — INDOMETHACIN 50 MG PO CAPS: 50.0000 mg | ORAL_CAPSULE | Freq: Three times a day (TID) | ORAL | 0 refills | Status: DC | PRN

## 2017-10-24 MED ORDER — AMLODIPINE BESYLATE 10 MG PO TABS: 10.0000 mg | ORAL_TABLET | Freq: Every day | ORAL | 1 refills | Status: DC

## 2017-10-24 MED ORDER — COLCHICINE 0.6 MG PO TABS: 0.6000 mg | ORAL_TABLET | Freq: Two times a day (BID) | ORAL | 2 refills | Status: DC

## 2017-10-24 MED ORDER — TELMISARTAN 80 MG PO TABS: 80.0000 mg | ORAL_TABLET | Freq: Every day | ORAL | 1 refills | Status: DC

## 2017-10-24 MED ORDER — DOXYCYCLINE HYCLATE 100 MG PO TABS: 100.0000 mg | ORAL_TABLET | Freq: Two times a day (BID) | ORAL | 0 refills | Status: DC

## 2017-10-24 NOTE — Progress Notes (Signed)
Subjective:  Patient ID: Russell Franco, male    DOB: 03/06/1966  Age: 52 y.o. MRN: [id number]  CC: Follow-up   HPI Russell Franco presents for follow-up of his hypertension, gout and obesity.  Blood pressures been well controlled on his current regimen.  Home readings have been in the 1 teens over 70s.  He has had no further gouty flares.  He is actually been able to to lose some weight.  He has been working out in [employer] gym and building muscle mass.  He feels much improved.  His plan is to get down to 180.  He was unable to go for nutrition counseling but is planning on starting a program offered at [employer].  He was unable to tolerate the locus Arin he calls back pain and headaches.  He has a rash she would like me to take a look at that is associated temporaneously with a tick bite.  Outpatient Medications Prior to Visit  Medication Sig Dispense Refill  . amLODipine (NORVASC) 10 MG tablet Take 1 tablet (10 mg total) by mouth daily. 90 tablet 1  . colchicine 0.6 MG tablet Take 1 tablet (0.6 mg total) by mouth 2 (two) times daily. As needed for attacks 60 tablet 2  . indomethacin (INDOCIN) 50 MG capsule Take 1 capsule (50 mg total) by mouth 3 (three) times daily as needed. With food. 60 capsule 0  . Lorcaserin HCl 10 MG TABS One twice each day. 60 tablet 2  . telmisartan-hydrochlorothiazide (MICARDIS HCT) 80-25 MG tablet Take 1 tablet by mouth daily. 90 tablet 1   No facility-administered medications prior to visit.     ROS Review of Systems  Constitutional: Positive for activity change. Negative for chills, diaphoresis, fatigue, fever and unexpected weight change.  HENT: Negative.   Eyes: Negative.   Respiratory: Negative.   Cardiovascular: Negative.   Gastrointestinal: Negative.   Genitourinary: Negative.   Musculoskeletal: Negative for arthralgias and myalgias.  Skin: Positive for color change and rash.  Allergic/Immunologic: Negative for immunocompromised state.  Neurological:  Negative for weakness and headaches.  Hematological: Does not bruise/bleed easily.  Psychiatric/Behavioral: Negative.     Objective:  BP 134/80   Pulse 82   Ht 5\' 11"  (1.803 m)   Wt 237 lb 2 oz (107.6 kg)   SpO2 97%   BMI 33.07 kg/m   BP Readings from Last 3 Encounters:  10/24/17 134/80  07/18/17 130/70  05/03/17 134/76    Wt Readings from Last 3 Encounters:  10/24/17 237 lb 2 oz (107.6 kg)  07/18/17 245 lb 6.4 oz (111.3 kg)  05/03/17 233 lb (105.7 kg)    Physical Exam  Constitutional: He is oriented to person, place, and time. He appears well-developed and well-nourished. No distress.  HENT:  Head: Normocephalic and atraumatic.  Right Ear: External ear normal.  Left Ear: External ear normal.  Mouth/Throat: Oropharynx is clear and moist. No oropharyngeal exudate.  Eyes: Pupils are equal, round, and reactive to light. Conjunctivae and EOM are normal. Right eye exhibits no discharge. Left eye exhibits no discharge. No scleral icterus.  Neck: Normal range of motion. No JVD present. No tracheal deviation present. No thyromegaly present.  Cardiovascular: Normal rate, regular rhythm and normal heart sounds.  Pulmonary/Chest: Effort normal and breath sounds normal.  Abdominal: Bowel sounds are normal.  Lymphadenopathy:    He has no cervical adenopathy.  Neurological: He is alert and oriented to person, place, and time.  Skin: Skin is warm and  dry. Rash noted. He is not diaphoretic. There is erythema.     Psychiatric: He has a normal mood and affect. His behavior is normal. Thought content normal.    Lab Results  Component Value Date   WBC 7.9 07/18/2017   HGB 14.1 07/18/2017   HCT 42.2 07/18/2017   PLT 346.0 07/18/2017   GLUCOSE 106 (H) 07/18/2017   CHOL 165 07/18/2017   TRIG 86.0 07/18/2017   HDL 51.30 07/18/2017   LDLCALC 96 07/18/2017   ALT 20 07/18/2017   AST 19 07/18/2017   NA 139 07/18/2017   K 3.8 07/18/2017   CL 97 07/18/2017   CREATININE 1.13  07/18/2017   BUN 19 07/18/2017   CO2 26 07/18/2017   TSH 2.31 07/18/2017   PSA 0.31 07/18/2017    Patient was never admitted.  Assessment & Plan:   Russell Franco was seen today for follow-up.  Diagnoses and all orders for this visit:  Essential hypertension -     telmisartan (MICARDIS) 80 MG tablet; Take 1 tablet (80 mg total) by mouth daily. -     amLODipine (NORVASC) 10 MG tablet; Take 1 tablet (10 mg total) by mouth daily.  Class 1 obesity due to excess calories without serious comorbidity with body mass index (BMI) of 34.0 to 34.9 in adult  Acute idiopathic gout, unspecified site -     indomethacin (INDOCIN) 50 MG capsule; Take 1 capsule (50 mg total) by mouth 3 (three) times daily as needed. With food. -     colchicine 0.6 MG tablet; Take 1 tablet (0.6 mg total) by mouth 2 (two) times daily. As needed for attacks  Erythema migrans (Lyme disease) -     doxycycline (VIBRA-TABS) 100 MG tablet; Take 1 tablet (100 mg total) by mouth 2 (two) times daily.  Strain of right knee, initial encounter   I have discontinued Russell Franco's telmisartan-hydrochlorothiazide and Lorcaserin HCl. I am also having him start on telmisartan and doxycycline. Additionally, I am having him maintain his indomethacin, colchicine, and amLODipine.  Meds ordered this encounter  Medications  . telmisartan (MICARDIS) 80 MG tablet    Sig: Take 1 tablet (80 mg total) by mouth daily.    Dispense:  90 tablet    Refill:  1  . indomethacin (INDOCIN) 50 MG capsule    Sig: Take 1 capsule (50 mg total) by mouth 3 (three) times daily as needed. With food.    Dispense:  60 capsule    Refill:  0  . colchicine 0.6 MG tablet    Sig: Take 1 tablet (0.6 mg total) by mouth 2 (two) times daily. As needed for attacks    Dispense:  60 tablet    Refill:  2  . amLODipine (NORVASC) 10 MG tablet    Sig: Take 1 tablet (10 mg total) by mouth daily.    Dispense:  90 tablet    Refill:  1  . doxycycline (VIBRA-TABS) 100 MG  tablet    Sig: Take 1 tablet (100 mg total) by mouth 2 (two) times daily.    Dispense:  20 tablet    Refill:  0   Patient's blood pressure is improved with his weight loss and workout regimen.  Am going to remove the HCTZ from the mycardis.  He will be checking his blood pressures and report to me pressures that are consistently over 140/90.  We will continue amlodipine.  Refills on Indocin and colchicine to be used as needed.  We will give  Doxy or tick bite and rash.  Sun exposure warnings were given.  Follow-up: Return in about 6 months (around 04/25/2018), or if symptoms worsen or fail to improve.  [name], MD

## 2018-04-25 ENCOUNTER — Ambulatory Visit: Payer: BC Managed Care – PPO | Admitting: Family Medicine

## 2018-04-25 ENCOUNTER — Encounter: Payer: Self-pay | Admitting: Family Medicine

## 2018-04-25 VITALS — BP 126/84 | HR 83 | Temp 98.5°F | Ht 71.0 in | Wt 234.0 lb

## 2018-04-25 DIAGNOSIS — Z6834 Body mass index (BMI) 34.0-34.9, adult: Secondary | ICD-10-CM

## 2018-04-25 DIAGNOSIS — Z23 Encounter for immunization: Secondary | ICD-10-CM

## 2018-04-25 DIAGNOSIS — M1000 Idiopathic gout, unspecified site: Secondary | ICD-10-CM

## 2018-04-25 DIAGNOSIS — E6609 Other obesity due to excess calories: Secondary | ICD-10-CM

## 2018-04-25 DIAGNOSIS — I10 Essential (primary) hypertension: Secondary | ICD-10-CM | POA: Diagnosis not present

## 2018-04-25 DIAGNOSIS — E790 Hyperuricemia without signs of inflammatory arthritis and tophaceous disease: Secondary | ICD-10-CM | POA: Diagnosis not present

## 2018-04-25 DIAGNOSIS — M109 Gout, unspecified: Secondary | ICD-10-CM | POA: Insufficient documentation

## 2018-04-25 MED ORDER — COLCHICINE 0.6 MG PO TABS: 0.6000 mg | ORAL_TABLET | Freq: Two times a day (BID) | ORAL | 2 refills | Status: DC

## 2018-04-25 MED ORDER — INDOMETHACIN 50 MG PO CAPS: 50.0000 mg | ORAL_CAPSULE | Freq: Three times a day (TID) | ORAL | 0 refills | Status: DC | PRN

## 2018-04-25 MED ORDER — AMLODIPINE BESYLATE 10 MG PO TABS: 10.0000 mg | ORAL_TABLET | Freq: Every day | ORAL | 1 refills | Status: DC

## 2018-04-25 MED ORDER — TELMISARTAN 80 MG PO TABS: 80.0000 mg | ORAL_TABLET | Freq: Every day | ORAL | 1 refills | Status: DC

## 2018-04-25 NOTE — Progress Notes (Signed)
Established Patient Office Visit  Subjective:  Patient ID: Russell Franco, male    DOB: April 13, 1966  Age: 52 y.o. MRN: [id number]  CC:  Chief Complaint  Patient presents with  . Follow-up    6 mo fu/request tdap,flu shot done 01/28/2018.     HPI Russell Franco presents for follow-up of his hypertension and elevated uric acid.  Patient has had no further gouty attacks status post discontinuation of HCTZ component of his to losartan.  Blood pressures been under control with the Micardis and amlodipine only.  He has reduced the amount of purine in his diet.  He knows that if he eats shrimp it will produce a gouty attack.  Continues to work out in [employer] gym.  He is building muscle mass and losing fat mass.  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  He is here with his wife today.  Patient does snore but she is witnessed no apneic episodes and he does feel rested in the morning does not drink alcohol.   Past Medical History:  Diagnosis Date  . Hypertension     History reviewed. No pertinent surgical history.  History reviewed. No pertinent family history.  Social History   Socioeconomic History  . Marital status: Single    Spouse name: Not on file  . Number of children: Not on file  . Years of education: Not on file  . Highest education level: Not on file  Occupational History  . Not on file  Social Needs  . Financial resource strain: Not on file  . Food insecurity:    Worry: Not on file    Inability: Not on file  . Transportation needs:    Medical: Not on file    Non-medical: Not on file  Tobacco Use  . Smoking status: Never Smoker  . Smokeless tobacco: Never Used  Substance and Sexual Activity  . Alcohol use: Not on file  . Drug use: Not on file  . Sexual activity: Not on file  Lifestyle  . Physical activity:    Days per week: Not on file    Minutes per session: Not on file  . Stress: Not on file  Relationships  . Social connections:    Talks on phone: Not  on file    Gets together: Not on file    Attends religious service: Not on file    Active member of club or organization: Not on file    Attends meetings of clubs or organizations: Not on file    Relationship status: Not on file  . Intimate partner violence:    Fear of current or ex partner: Not on file    Emotionally abused: Not on file    Physically abused: Not on file    Forced sexual activity: Not on file  Other Topics Concern  . Not on file  Social History Narrative  . Not on file    Outpatient Medications Prior to Visit  Medication Sig Dispense Refill  . amLODipine (NORVASC) 10 MG tablet Take 1 tablet (10 mg total) by mouth daily. 90 tablet 1  . colchicine 0.6 MG tablet Take 1 tablet (0.6 mg total) by mouth 2 (two) times daily. As needed for attacks 60 tablet 2  . indomethacin (INDOCIN) 50 MG capsule Take 1 capsule (50 mg total) by mouth 3 (three) times daily as needed. With food. 60 capsule 0  . telmisartan (MICARDIS) 80 MG tablet Take 1 tablet (80 mg total) by mouth  daily. 90 tablet 1  . doxycycline (VIBRA-TABS) 100 MG tablet Take 1 tablet (100 mg total) by mouth 2 (two) times daily. 20 tablet 0   No facility-administered medications prior to visit.     Allergies  Allergen Reactions  . Penicillin G Other (See Comments) and Rash    Hives  . Penicillins Itching and Rash    ROS Review of Systems  Constitutional: Negative for chills, diaphoresis, fatigue, fever and unexpected weight change.  HENT: Negative.   Eyes: Negative for photophobia and visual disturbance.  Respiratory: Negative.   Cardiovascular: Negative.   Gastrointestinal: Negative.   Genitourinary: Negative.   Musculoskeletal: Negative for arthralgias.  Skin: Negative for pallor and rash.  Psychiatric/Behavioral: Negative.       Objective:    Physical Exam  Constitutional: He is oriented to person, place, and time. He appears well-developed and well-nourished. No distress.  HENT:  Head:  Normocephalic and atraumatic.  Right Ear: External ear normal.  Left Ear: External ear normal.  Eyes: Right eye exhibits no discharge. Left eye exhibits no discharge. No scleral icterus.  Neck: No JVD present. No tracheal deviation present.  Pulmonary/Chest: Effort normal. No stridor.  Neurological: He is alert and oriented to person, place, and time.  Skin: Skin is warm and dry. He is not diaphoretic.  Psychiatric: He has a normal mood and affect. His behavior is normal.    BP 126/84   Pulse 83   Temp 98.5 F (36.9 C) (Oral)   Ht 5\' 11"  (1.803 m)   Wt 234 lb (106.1 kg)   SpO2 99%   BMI 32.64 kg/m  Wt Readings from Last 3 Encounters:  04/25/18 234 lb (106.1 kg)  10/24/17 237 lb 2 oz (107.6 kg)  07/18/17 245 lb 6.4 oz (111.3 kg)   BP Readings from Last 3 Encounters:  04/25/18 126/84  10/24/17 134/80  07/18/17 130/70   Guideline developer:  UpToDate (see UpToDate for funding source) Date Released: June 2014  Health Maintenance Due  Topic Date Due  . HIV Screening  12/19/1980  . TETANUS/TDAP  12/19/1984  . COLONOSCOPY  12/20/2015    There are no preventive care reminders to display for this patient.  Lab Results  Component Value Date   TSH 2.31 07/18/2017   Lab Results  Component Value Date   WBC 7.9 07/18/2017   HGB 14.1 07/18/2017   HCT 42.2 07/18/2017   MCV 95.7 07/18/2017   PLT 346.0 07/18/2017   Lab Results  Component Value Date   NA 139 07/18/2017   K 3.8 07/18/2017   CO2 26 07/18/2017   GLUCOSE 106 (H) 07/18/2017   BUN 19 07/18/2017   CREATININE 1.13 07/18/2017   BILITOT 0.5 07/18/2017   ALKPHOS 79 07/18/2017   AST 19 07/18/2017   ALT 20 07/18/2017   PROT 8.1 07/18/2017   ALBUMIN 4.4 07/18/2017   CALCIUM 9.7 07/18/2017   GFR 72.55 07/18/2017   Lab Results  Component Value Date   CHOL 165 07/18/2017   Lab Results  Component Value Date   HDL 51.30 07/18/2017   Lab Results  Component Value Date   LDLCALC 96 07/18/2017   Lab Results    Component Value Date   TRIG 86.0 07/18/2017   Lab Results  Component Value Date   CHOLHDL 3 07/18/2017   No results found for: HGBA1C    Assessment & Plan:   Problem List Items Addressed This Visit      Cardiovascular and Mediastinum   Essential  hypertension - Primary   Relevant Medications   amLODipine (NORVASC) 10 MG tablet   telmisartan (MICARDIS) 80 MG tablet   Other Relevant Orders   CBC   Comprehensive metabolic panel     Other   Acute idiopathic gout   Relevant Medications   colchicine 0.6 MG tablet   indomethacin (INDOCIN) 50 MG capsule   Class 1 obesity due to excess calories without serious comorbidity with body mass index (BMI) of 34.0 to 34.9 in adult   Elevated uric acid in blood   Relevant Orders   Uric acid      Meds ordered this encounter  Medications  . amLODipine (NORVASC) 10 MG tablet    Sig: Take 1 tablet (10 mg total) by mouth daily.    Dispense:  90 tablet    Refill:  1  . colchicine 0.6 MG tablet    Sig: Take 1 tablet (0.6 mg total) by mouth 2 (two) times daily. As needed for attacks    Dispense:  60 tablet    Refill:  2  . indomethacin (INDOCIN) 50 MG capsule    Sig: Take 1 capsule (50 mg total) by mouth 3 (three) times daily as needed. With food.    Dispense:  60 capsule    Refill:  0  . telmisartan (MICARDIS) 80 MG tablet    Sig: Take 1 tablet (80 mg total) by mouth daily.    Dispense:  90 tablet    Refill:  1    Follow-up: Return in about 6 months (around 10/25/2018).   Patient will continue working out on losing weight.  Follow-up is for 6 months.  We discussed using allopurinol to lower the uric acid and he would like to try weight loss first.

## 2018-04-25 NOTE — Addendum Note (Signed)
Addended byLivingston Diones: Particia Strahm on: 04/25/2018 04:28 PM   Modules accepted: Orders

## 2018-04-26 LAB — CBC
HCT: 40.8 % (ref 38.5–50.0)
Hemoglobin: 14.5 g/dL (ref 13.2–17.1)
MCH: 31.9 pg (ref 27.0–33.0)
MCHC: 35.5 g/dL (ref 32.0–36.0)
MCV: 89.9 fL (ref 80.0–100.0)
MPV: 9.1 fL (ref 7.5–12.5)
Platelets: 507 10*3/uL — ABNORMAL HIGH (ref 140–400)
RBC: 4.54 10*6/uL (ref 4.20–5.80)
RDW: 13.8 % (ref 11.0–15.0)
WBC: 8.6 10*3/uL (ref 3.8–10.8)

## 2018-04-26 LAB — COMPREHENSIVE METABOLIC PANEL
AG Ratio: 1 (calc) (ref 1.0–2.5)
ALT: 22 U/L (ref 9–46)
AST: 17 U/L (ref 10–35)
Albumin: 4.2 g/dL (ref 3.6–5.1)
Alkaline phosphatase (APISO): 97 U/L (ref 40–115)
BUN: 15 mg/dL (ref 7–25)
CALCIUM: 9.5 mg/dL (ref 8.6–10.3)
CO2: 24 mmol/L (ref 20–32)
Chloride: 100 mmol/L (ref 98–110)
Creat: 1.06 mg/dL (ref 0.70–1.33)
Globulin: 4.4 g/dL (calc) — ABNORMAL HIGH (ref 1.9–3.7)
Glucose, Bld: 86 mg/dL (ref 65–99)
POTASSIUM: 3.9 mmol/L (ref 3.5–5.3)
Sodium: 136 mmol/L (ref 135–146)
Total Bilirubin: 0.4 mg/dL (ref 0.2–1.2)
Total Protein: 8.6 g/dL — ABNORMAL HIGH (ref 6.1–8.1)

## 2018-04-26 LAB — URIC ACID: Uric Acid, Serum: 10.7 mg/dL — ABNORMAL HIGH (ref 4.0–8.0)

## 2018-04-28 ENCOUNTER — Other Ambulatory Visit: Payer: Self-pay

## 2018-04-28 DIAGNOSIS — R7989 Other specified abnormal findings of blood chemistry: Secondary | ICD-10-CM

## 2018-06-18 ENCOUNTER — Encounter: Payer: Self-pay | Admitting: Family Medicine

## 2018-06-18 ENCOUNTER — Ambulatory Visit: Payer: BC Managed Care – PPO | Admitting: Family Medicine

## 2018-06-18 VITALS — BP 130/80 | HR 108 | Ht 71.0 in | Wt 244.5 lb

## 2018-06-18 DIAGNOSIS — M79642 Pain in left hand: Secondary | ICD-10-CM | POA: Insufficient documentation

## 2018-06-18 DIAGNOSIS — S56912A Strain of unspecified muscles, fascia and tendons at forearm level, left arm, initial encounter: Secondary | ICD-10-CM

## 2018-06-18 MED ORDER — TRAMADOL HCL 50 MG PO TABS: ORAL_TABLET | ORAL | 0 refills | Status: DC

## 2018-06-18 MED ORDER — MELOXICAM 15 MG PO TABS: 15.0000 mg | ORAL_TABLET | Freq: Every day | ORAL | 0 refills | Status: DC

## 2018-06-18 NOTE — Progress Notes (Signed)
Established Patient Office Visit  Subjective:  Patient ID: Russell Franco, Russell Franco    DOB: 27-Dec-1965  Age: 53 y.o. MRN: [id number]  CC:  Chief Complaint  Patient presents with  . left arm pain    HPI Russell Franco presents for treatment and evaluation of severe left forearm pain status post working out in the gym a few days prior.  There is not any immediate injury.  His forearm is swollen and painful.  Pain seems to be concentrated at the volar distal wrist.  He had been doing biceps curls and forearm strengthening exercises.  He is right-hand dominant.  He has been going to the gym for some time now.  Past Medical History:  Diagnosis Date  . Hypertension     History reviewed. No pertinent surgical history.  History reviewed. No pertinent family history.  Social History   Socioeconomic History  . Marital status: Single    Spouse name: Not on file  . Number of children: Not on file  . Years of education: Not on file  . Highest education level: Not on file  Occupational History  . Not on file  Social Needs  . Financial resource strain: Not on file  . Food insecurity:    Worry: Not on file    Inability: Not on file  . Transportation needs:    Medical: Not on file    Non-medical: Not on file  Tobacco Use  . Smoking status: Never Smoker  . Smokeless tobacco: Never Used  Substance and Sexual Activity  . Alcohol use: Not on file  . Drug use: Not on file  . Sexual activity: Not on file  Lifestyle  . Physical activity:    Days per week: Not on file    Minutes per session: Not on file  . Stress: Not on file  Relationships  . Social connections:    Talks on phone: Not on file    Gets together: Not on file    Attends religious service: Not on file    Active member of club or organization: Not on file    Attends meetings of clubs or organizations: Not on file    Relationship status: Not on file  . Intimate partner violence:    Fear of current or ex partner: Not  on file    Emotionally abused: Not on file    Physically abused: Not on file    Forced sexual activity: Not on file  Other Topics Concern  . Not on file  Social History Narrative  . Not on file    Outpatient Medications Prior to Visit  Medication Sig Dispense Refill  . amLODipine (NORVASC) 10 MG tablet Take 1 tablet (10 mg total) by mouth daily. 90 tablet 1  . colchicine 0.6 MG tablet Take 1 tablet (0.6 mg total) by mouth 2 (two) times daily. As needed for attacks 60 tablet 2  . indomethacin (INDOCIN) 50 MG capsule Take 1 capsule (50 mg total) by mouth 3 (three) times daily as needed. With food. 60 capsule 0  . telmisartan (MICARDIS) 80 MG tablet Take 1 tablet (80 mg total) by mouth daily. 90 tablet 1   No facility-administered medications prior to visit.     Allergies  Allergen Reactions  . Penicillin G Other (See Comments) and Rash    Hives  . Penicillins Itching and Rash    ROS Review of Systems  Constitutional: Negative.   Musculoskeletal: Positive for myalgias.  Neurological: Negative for weakness  and numbness.  Hematological: Does not bruise/bleed easily.  Psychiatric/Behavioral: Negative.       Objective:    Physical Exam  Constitutional: He is oriented to person, place, and time. He appears well-developed. No distress.  HENT:  Head: Normocephalic and atraumatic.  Right Ear: External ear normal.  Left Ear: External ear normal.  Cardiovascular:  Pulses:      Radial pulses are 2+ on the left side.  Pulmonary/Chest: Effort normal.  Musculoskeletal:     Right forearm: Normal. He exhibits no tenderness, no bony tenderness, no swelling, no edema and no deformity.     Left forearm: He exhibits tenderness, bony tenderness and swelling. He exhibits no deformity.       Arms:  Neurological: He is alert and oriented to person, place, and time.  Skin: Skin is warm and dry. He is not diaphoretic.  Psychiatric: He has a normal mood and affect. His behavior is normal.      BP 130/80   Pulse (!) 108   Ht 5\' 11"  (1.803 m)   Wt 244 lb 8 oz (110.9 kg)   SpO2 98%   BMI 34.10 kg/m  Wt Readings from Last 3 Encounters:  06/18/18 244 lb 8 oz (110.9 kg)  04/25/18 234 lb (106.1 kg)  10/24/17 237 lb 2 oz (107.6 kg)   BP Readings from Last 3 Encounters:  06/18/18 130/80  04/25/18 126/84  10/24/17 134/80   Guideline developer:  UpToDate (see UpToDate for funding source) Date Released: June 2014  Health Maintenance Due  Topic Date Due  . HIV Screening  12/19/1980  . COLONOSCOPY  12/20/2015    There are no preventive care reminders to display for this patient.  Lab Results  Component Value Date   TSH 2.31 07/18/2017   Lab Results  Component Value Date   WBC 8.6 04/25/2018   HGB 14.5 04/25/2018   HCT 40.8 04/25/2018   MCV 89.9 04/25/2018   PLT 507 (H) 04/25/2018   Lab Results  Component Value Date   NA 136 04/25/2018   K 3.9 04/25/2018   CO2 24 04/25/2018   GLUCOSE 86 04/25/2018   BUN 15 04/25/2018   CREATININE 1.06 04/25/2018   BILITOT 0.4 04/25/2018   ALKPHOS 79 07/18/2017   AST 17 04/25/2018   ALT 22 04/25/2018   PROT 8.6 (H) 04/25/2018   ALBUMIN 4.4 07/18/2017   CALCIUM 9.5 04/25/2018   GFR 72.55 07/18/2017   Lab Results  Component Value Date   CHOL 165 07/18/2017   Lab Results  Component Value Date   HDL 51.30 07/18/2017   Lab Results  Component Value Date   LDLCALC 96 07/18/2017   Lab Results  Component Value Date   TRIG 86.0 07/18/2017   Lab Results  Component Value Date   CHOLHDL 3 07/18/2017   No results found for: HGBA1C    Assessment & Plan:   Problem List Items Addressed This Visit      Musculoskeletal and Integument   Forearm strain, left, initial encounter - Primary   Relevant Medications   meloxicam (MOBIC) 15 MG tablet   traMADol (ULTRAM) 50 MG tablet   Other Relevant Orders   Ambulatory referral to Sports Medicine      Meds ordered this encounter  Medications  . meloxicam (MOBIC) 15  MG tablet    Sig: Take 1 tablet (15 mg total) by mouth daily. With food for ten days and then as needed.    Dispense:  30 tablet  Refill:  0  . traMADol (ULTRAM) 50 MG tablet    Sig: Take one or two at night as needed.    Dispense:  15 tablet    Refill:  0    Follow-up: Return use cock up splint.

## 2018-06-23 ENCOUNTER — Encounter: Payer: Self-pay | Admitting: Family Medicine

## 2018-06-23 ENCOUNTER — Ambulatory Visit: Payer: Self-pay

## 2018-06-23 ENCOUNTER — Ambulatory Visit: Payer: BC Managed Care – PPO | Admitting: Family Medicine

## 2018-06-23 VITALS — BP 118/80 | HR 96 | Temp 98.4°F | Ht 71.0 in | Wt 241.8 lb

## 2018-06-23 DIAGNOSIS — M79642 Pain in left hand: Secondary | ICD-10-CM

## 2018-06-23 MED ORDER — ALLOPURINOL 100 MG PO TABS: 100.0000 mg | ORAL_TABLET | Freq: Every day | ORAL | 0 refills | Status: DC

## 2018-06-23 NOTE — Progress Notes (Signed)
Russell Franco - 53 y.o. male MRN [id number]  Date of birth: 1965/11/26  SUBJECTIVE:  Including CC & ROS.  Chief Complaint  Patient presents with  . Pain    left forearm pain/ given brace and anti-inflammatory meds a lot better     Russell Franco is a 53 y.o. male that is presenting with left hand pain.  The scarring on the dorsal aspect of the left hand.  He reports his symptoms started last week.  He went to workout and did not have any trauma or injury.  2 days later he had significant posterior forearm pain and dorsal hand pain.  Was seen by his primary doctor and prescribed meloxicam.  Has had some improvement of his symptoms but still does have swelling and redness of the dorsal extensors.  Has been wearing a cock-up splint with limited improvement.  He does have a history of gout.  No fever or chills.   Review of Systems  Constitutional: Negative for fever.  HENT: Negative for congestion.   Respiratory: Negative for cough.   Cardiovascular: Negative for chest pain.  Gastrointestinal: Negative for abdominal pain.  Musculoskeletal: Positive for gait problem.  Skin: Positive for color change.  Neurological: Negative for weakness.  Hematological: Negative for adenopathy.  Psychiatric/Behavioral: Negative for agitation.    HISTORY: Past Medical, Surgical, Social, and Family History Reviewed & Updated per EMR.   Pertinent Historical Findings include:  Past Medical History:  Diagnosis Date  . Hypertension     No past surgical history on file.  Allergies  Allergen Reactions  . Penicillin G Other (See Comments) and Rash    Hives  . Penicillins Itching and Rash    No family history on file.   Social History   Socioeconomic History  . Marital status: Single    Spouse name: Not on file  . Number of children: Not on file  . Years of education: Not on file  . Highest education level: Not on file  Occupational History  . Not on file  Social Needs  . Financial  resource strain: Not on file  . Food insecurity:    Worry: Not on file    Inability: Not on file  . Transportation needs:    Medical: Not on file    Non-medical: Not on file  Tobacco Use  . Smoking status: Never Smoker  . Smokeless tobacco: Never Used  Substance and Sexual Activity  . Alcohol use: Not on file  . Drug use: Not on file  . Sexual activity: Not on file  Lifestyle  . Physical activity:    Days per week: Not on file    Minutes per session: Not on file  . Stress: Not on file  Relationships  . Social connections:    Talks on phone: Not on file    Gets together: Not on file    Attends religious service: Not on file    Active member of club or organization: Not on file    Attends meetings of clubs or organizations: Not on file    Relationship status: Not on file  . Intimate partner violence:    Fear of current or ex partner: Not on file    Emotionally abused: Not on file    Physically abused: Not on file    Forced sexual activity: Not on file  Other Topics Concern  . Not on file  Social History Narrative  . Not on file     PHYSICAL EXAM:  VS: BP 118/80   Pulse 96   Temp 98.4 F (36.9 C) (Oral)   Ht 5\' 11"  (1.803 m)   Wt 241 lb 12.8 oz (109.7 kg)   SpO2 99%   BMI 33.72 kg/m  Physical Exam Gen: NAD, alert, cooperative with exam, well-appearing ENT: normal lips, normal nasal mucosa,  Eye: normal EOM, normal conjunctiva and lids CV:  no edema, +2 pedal pulses   Resp: no accessory muscle use, non-labored,  Skin: no rashes, no areas of induration  Neuro: normal tone, normal sensation to touch Psych:  normal insight, alert and oriented MSK:  Left ankle Redness and swelling of the dorsal extensors.  Occurring from second, third, and fourth. Normal finger strength abduction and abduction. Normal wrist range of motion. All digits appear to be swollen but no redness. Neurovascular intact  Limited ultrasound: Left hand:  The second and third extensor  tendons are significantly edematous with crystalline changes to suggest a gouty tenosynovitis. The second and third MCP joints do not have any effusion. Normal-appearing metatarsals  Summary: Findings suggest extensor tenosynovitis related to gouty flare  Ultrasound and interpretation by Clare Gandy, MD      ASSESSMENT & PLAN:   Left hand pain Findings suggest he is having a gouty flare with associated tenosynovitis of the extensor tendons.  No suggestion of fever.  Has had some improvement with meloxicam. -He has colchicine at home that he can begin. -Started allopurinol.  Most recent uric acid was 10.7.  Counseled on follow-up and titration as needed. -If no improvement can consider steroids if needed.

## 2018-06-23 NOTE — Assessment & Plan Note (Signed)
Findings suggest he is having a gouty flare with associated tenosynovitis of the extensor tendons.  No suggestion of fever.  Has had some improvement with meloxicam. -He has colchicine at home that he can begin. -Started allopurinol.  Most recent uric acid was 10.7.  Counseled on follow-up and titration as needed. -If no improvement can consider steroids if needed.

## 2018-06-23 NOTE — Patient Instructions (Signed)
Good to see you  Please take the colchicine.  You can take 3 tablets today and then 1 to 2 tablets/day as needed.  Please take this until 2 days after your last symptom. Please start allopurinol.  You will need to follow-up with Dr. Doreene Burke to decide what dose that you will need to be at. Please see me back if no improvement

## 2018-07-12 ENCOUNTER — Other Ambulatory Visit: Payer: Self-pay | Admitting: Family Medicine

## 2018-07-12 DIAGNOSIS — M79642 Pain in left hand: Secondary | ICD-10-CM

## 2018-07-14 MED ORDER — ALLOPURINOL 100 MG PO TABS: 100.0000 mg | ORAL_TABLET | Freq: Every day | ORAL | 0 refills | Status: DC

## 2018-08-06 ENCOUNTER — Telehealth: Payer: Self-pay | Admitting: Family Medicine

## 2018-08-06 NOTE — Telephone Encounter (Signed)
Needs a follow up appointment.

## 2018-08-06 NOTE — Telephone Encounter (Signed)
I called patient to change his appointment to a virtual on for 08/22/2018, He wasn't sure if he still needed this appointment because it was a follow up to the visit with Dr. Jordan Likes. Patient said he is doing fine and the only thing he might need is a refill on allopurinol. He just wants to know if he needs to keep appt or cancel.

## 2018-08-06 NOTE — Telephone Encounter (Signed)
Done

## 2018-08-21 NOTE — Progress Notes (Signed)
Established Patient Office Visit  Subjective:  Patient ID: Russell Franco, male    DOB: 08-26-1965  Age: 53 y.o. MRN: [id number]  CC:  Chief Complaint  Patient presents with  . Follow-up    Gout episode, Seen [facility]ikes, Need 90 Day QT# Rx refill for ALlpurinol    HPI Russell Franco presents for follow-up of his hypertension and gout.  Patient has had no further gouty flares since starting the allopurinol.  He is having no issues taking it.  He did not take the colchicine twice daily with the start of the allopurinol.  Blood pressure has been well controlled with the Micardis and Norvasc combination he is having no issues taking those blood pressure medicines.  Pressure is been in the 120s over upper 70s range.  Past Medical History:  Diagnosis Date  . Hypertension     No past surgical history on file.  No family history on file.  Social History   Socioeconomic History  . Marital status: Single    Spouse name: Not on file  . Number of children: Not on file  . Years of education: Not on file  . Highest education level: Not on file  Occupational History  . Not on file  Social Needs  . Financial resource strain: Not on file  . Food insecurity:    Worry: Not on file    Inability: Not on file  . Transportation needs:    Medical: Not on file    Non-medical: Not on file  Tobacco Use  . Smoking status: Never Smoker  . Smokeless tobacco: Never Used  Substance and Sexual Activity  . Alcohol use: Not on file  . Drug use: Not on file  . Sexual activity: Not on file  Lifestyle  . Physical activity:    Days per week: Not on file    Minutes per session: Not on file  . Stress: Not on file  Relationships  . Social connections:    Talks on phone: Not on file    Gets together: Not on file    Attends religious service: Not on file    Active member of club or organization: Not on file    Attends meetings of clubs or organizations: Not on file    Relationship status: Not on  file  . Intimate partner violence:    Fear of current or ex partner: Not on file    Emotionally abused: Not on file    Physically abused: Not on file    Forced sexual activity: Not on file  Other Topics Concern  . Not on file  Social History Narrative  . Not on file    Outpatient Medications Prior to Visit  Medication Sig Dispense Refill  . allopurinol (ZYLOPRIM) 100 MG tablet Take 1 tablet (100 mg total) by mouth daily. 30 tablet 0  . amLODipine (NORVASC) 10 MG tablet Take 1 tablet (10 mg total) by mouth daily. 90 tablet 1  . colchicine 0.6 MG tablet Take 1 tablet (0.6 mg total) by mouth 2 (two) times daily. As needed for attacks 60 tablet 2  . indomethacin (INDOCIN) 50 MG capsule Take 1 capsule (50 mg total) by mouth 3 (three) times daily as needed. With food. 60 capsule 0  . meloxicam (MOBIC) 15 MG tablet Take 1 tablet (15 mg total) by mouth daily. With food for ten days and then as needed. 30 tablet 0  . telmisartan (MICARDIS) 80 MG tablet Take 1 tablet (80 mg total)  by mouth daily. 90 tablet 1  . traMADol (ULTRAM) 50 MG tablet Take one or two at night as needed. 15 tablet 0   No facility-administered medications prior to visit.     Allergies  Allergen Reactions  . Penicillin G Other (See Comments) and Rash    Hives  . Penicillins Itching and Rash    ROS Review of Systems  Constitutional: Negative.   Respiratory: Negative.   Cardiovascular: Negative.   Gastrointestinal: Negative.   Genitourinary: Negative.   Neurological: Negative.   Psychiatric/Behavioral: Negative.       Objective:    Physical Exam  Constitutional: He is oriented to person, place, and time. He appears well-developed and well-nourished. No distress.  HENT:  Head: Normocephalic and atraumatic.  Right Ear: External ear normal.  Left Ear: External ear normal.  Eyes: Conjunctivae are normal. Right eye exhibits no discharge. Left eye exhibits no discharge. No scleral icterus.  Neck: No JVD  present. No tracheal deviation present.  Pulmonary/Chest: Effort normal. No stridor.  Neurological: He is alert and oriented to person, place, and time.  Skin: Skin is warm and dry. He is not diaphoretic.  Psychiatric: He has a normal mood and affect. His behavior is normal.    BP 119/77 Comment: Pt had BP cuff  Pulse 77 Comment: Pt reports from hm  Temp 98.4 F (36.9 C) (Oral)   Wt 237 lb (107.5 kg) Comment: Pt reported from hm  BMI 33.05 kg/m  Wt Readings from Last 3 Encounters:  08/22/18 237 lb (107.5 kg)  06/23/18 241 lb 12.8 oz (109.7 kg)  06/18/18 244 lb 8 oz (110.9 kg)     Health Maintenance Due  Topic Date Due  . HIV Screening  12/19/1980  . COLONOSCOPY  12/20/2015    There are no preventive care reminders to display for this patient.  Lab Results  Component Value Date   TSH 2.31 07/18/2017   Lab Results  Component Value Date   WBC 8.6 04/25/2018   HGB 14.5 04/25/2018   HCT 40.8 04/25/2018   MCV 89.9 04/25/2018   PLT 507 (H) 04/25/2018   Lab Results  Component Value Date   NA 136 04/25/2018   K 3.9 04/25/2018   CO2 24 04/25/2018   GLUCOSE 86 04/25/2018   BUN 15 04/25/2018   CREATININE 1.06 04/25/2018   BILITOT 0.4 04/25/2018   ALKPHOS 79 07/18/2017   AST 17 04/25/2018   ALT 22 04/25/2018   PROT 8.6 (H) 04/25/2018   ALBUMIN 4.4 07/18/2017   CALCIUM 9.5 04/25/2018   GFR 72.55 07/18/2017   Lab Results  Component Value Date   CHOL 165 07/18/2017   Lab Results  Component Value Date   HDL 51.30 07/18/2017   Lab Results  Component Value Date   LDLCALC 96 07/18/2017   Lab Results  Component Value Date   TRIG 86.0 07/18/2017   Lab Results  Component Value Date   CHOLHDL 3 07/18/2017   No results found for: HGBA1C    Assessment & Franco:   Problem List Items Addressed This Visit    None      No orders of the defined types were placed in this encounter.   Follow-up: No follow-ups on file.    [name], MDVirtual  Visit via Video Note  I connected with Russell Franco on 08/21/18 at  3:00 PM EDT by a video enabled telemedicine application and verified that I am speaking with the correct person using two identifiers.  I discussed the limitations of evaluation and management by telemedicine and the availability of in person appointments. The patient expressed understanding and agreed to proceed.  History of Present Illness:    Observations/Objective:   Assessment and Franco:   Follow Up Instructions:    I discussed the assessment and treatment Franco with the patient. The patient was provided an opportunity to ask questions and all were answered. The patient agreed with the Franco and demonstrated an understanding of the instructions.   The patient was advised to call back or seek an in-person evaluation if the symptoms worsen or if the condition fails to improve as anticipated.  I provided 15 minutes of non-face-to-face time during this encounter.  Patient will call and make an appointment for his lab work.  Continue medicines as above.  May adjust his allopurinol pending results of his uric acid level.  He is to follow-up in July for a physical or sooner as needed.

## 2018-08-22 ENCOUNTER — Encounter: Payer: Self-pay | Admitting: Family Medicine

## 2018-08-22 ENCOUNTER — Ambulatory Visit (INDEPENDENT_AMBULATORY_CARE_PROVIDER_SITE_OTHER): Payer: BC Managed Care – PPO | Admitting: Family Medicine

## 2018-08-22 VITALS — BP 119/77 | HR 77 | Temp 98.4°F | Wt 237.0 lb

## 2018-08-22 DIAGNOSIS — I10 Essential (primary) hypertension: Secondary | ICD-10-CM | POA: Diagnosis not present

## 2018-08-22 DIAGNOSIS — E790 Hyperuricemia without signs of inflammatory arthritis and tophaceous disease: Secondary | ICD-10-CM

## 2018-08-22 DIAGNOSIS — M79642 Pain in left hand: Secondary | ICD-10-CM | POA: Diagnosis not present

## 2018-08-22 MED ORDER — ALLOPURINOL 100 MG PO TABS: 100.0000 mg | ORAL_TABLET | Freq: Every day | ORAL | 0 refills | Status: DC

## 2018-08-27 ENCOUNTER — Encounter: Payer: Self-pay | Admitting: Family Medicine

## 2018-08-27 NOTE — Progress Notes (Signed)
Thanks

## 2018-09-03 ENCOUNTER — Other Ambulatory Visit (INDEPENDENT_AMBULATORY_CARE_PROVIDER_SITE_OTHER): Payer: BC Managed Care – PPO

## 2018-09-03 ENCOUNTER — Other Ambulatory Visit: Payer: Self-pay

## 2018-09-03 DIAGNOSIS — R7989 Other specified abnormal findings of blood chemistry: Secondary | ICD-10-CM | POA: Diagnosis not present

## 2018-09-03 NOTE — Addendum Note (Signed)
Addended by: Varney Biles on: 09/03/2018 07:40 AM   Modules accepted: Orders

## 2018-09-04 LAB — CBC
HCT: 41.1 % (ref 38.5–50.0)
Hemoglobin: 14.4 g/dL (ref 13.2–17.1)
MCH: 31.6 pg (ref 27.0–33.0)
MCHC: 35 g/dL (ref 32.0–36.0)
MCV: 90.3 fL (ref 80.0–100.0)
MPV: 10 fL (ref 7.5–12.5)
Platelets: 346 10*3/uL (ref 140–400)
RBC: 4.55 10*6/uL (ref 4.20–5.80)
RDW: 14.6 % (ref 11.0–15.0)
WBC: 7.8 10*3/uL (ref 3.8–10.8)

## 2018-10-29 ENCOUNTER — Telehealth: Payer: Self-pay

## 2018-10-29 ENCOUNTER — Ambulatory Visit: Payer: BC Managed Care – PPO | Admitting: Family Medicine

## 2018-10-29 NOTE — Telephone Encounter (Signed)

## 2018-10-30 ENCOUNTER — Ambulatory Visit: Payer: BC Managed Care – PPO | Admitting: Family Medicine

## 2018-10-30 ENCOUNTER — Encounter: Payer: Self-pay | Admitting: Family Medicine

## 2018-10-30 VITALS — BP 130/78 | HR 98 | Ht 71.0 in | Wt 245.0 lb

## 2018-10-30 DIAGNOSIS — E790 Hyperuricemia without signs of inflammatory arthritis and tophaceous disease: Secondary | ICD-10-CM

## 2018-10-30 DIAGNOSIS — I10 Essential (primary) hypertension: Secondary | ICD-10-CM

## 2018-10-30 DIAGNOSIS — M109 Gout, unspecified: Secondary | ICD-10-CM | POA: Diagnosis not present

## 2018-10-30 DIAGNOSIS — M79642 Pain in left hand: Secondary | ICD-10-CM

## 2018-10-30 DIAGNOSIS — Z0000 Encounter for general adult medical examination without abnormal findings: Secondary | ICD-10-CM

## 2018-10-30 MED ORDER — AMLODIPINE BESYLATE 10 MG PO TABS: 10.0000 mg | ORAL_TABLET | Freq: Every day | ORAL | 1 refills | Status: DC

## 2018-10-30 MED ORDER — TELMISARTAN 80 MG PO TABS: 80.0000 mg | ORAL_TABLET | Freq: Every day | ORAL | 1 refills | Status: DC

## 2018-10-30 MED ORDER — ALLOPURINOL 100 MG PO TABS: 100.0000 mg | ORAL_TABLET | Freq: Every day | ORAL | 1 refills | Status: DC

## 2018-10-30 NOTE — Addendum Note (Signed)
Addended by: Rodrigo Ran on: 10/30/2018 03:35 PM   Modules accepted: Orders

## 2018-10-30 NOTE — Progress Notes (Signed)
Established Patient Office Visit  Subjective:  Patient ID: Russell Franco, male    DOB: November 21, 1965  Age: 53 y.o. MRN: [id number]  CC:  Chief Complaint  Patient presents with  . Follow-up    HPI Russell Franco presents for complete physical.  Blood pressure is well controlled with the Micardis and he is tolerating it well.  He continues to take allopurinol on a daily basis.  No further gouty attacks since his last visit.  Continues to exercise by walking but has been unable to make it back to the gym and has gained some weight.  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  Past Medical History:  Diagnosis Date  . Hypertension     History reviewed. No pertinent surgical history.  History reviewed. No pertinent family history.  Social History   Socioeconomic History  . Marital status: Single    Spouse name: Not on file  . Number of children: Not on file  . Years of education: Not on file  . Highest education level: Not on file  Occupational History  . Not on file  Social Needs  . Financial resource strain: Not on file  . Food insecurity    Worry: Not on file    Inability: Not on file  . Transportation needs    Medical: Not on file    Non-medical: Not on file  Tobacco Use  . Smoking status: Never Smoker  . Smokeless tobacco: Never Used  Substance and Sexual Activity  . Alcohol use: Not on file  . Drug use: Not on file  . Sexual activity: Not on file  Lifestyle  . Physical activity    Days per week: Not on file    Minutes per session: Not on file  . Stress: Not on file  Relationships  . Social Herbalist on phone: Not on file    Gets together: Not on file    Attends religious service: Not on file    Active member of club or organization: Not on file    Attends meetings of clubs or organizations: Not on file    Relationship status: Not on file  . Intimate partner violence    Fear of current or ex partner: Not on file    Emotionally  abused: Not on file    Physically abused: Not on file    Forced sexual activity: Not on file  Other Topics Concern  . Not on file  Social History Narrative  . Not on file    Outpatient Medications Prior to Visit  Medication Sig Dispense Refill  . allopurinol (ZYLOPRIM) 100 MG tablet Take 1 tablet (100 mg total) by mouth daily. 100 tablet 0  . amLODipine (NORVASC) 10 MG tablet Take 1 tablet (10 mg total) by mouth daily. 90 tablet 1  . colchicine 0.6 MG tablet Take 1 tablet (0.6 mg total) by mouth 2 (two) times daily. As needed for attacks 60 tablet 2  . indomethacin (INDOCIN) 50 MG capsule Take 1 capsule (50 mg total) by mouth 3 (three) times daily as needed. With food. 60 capsule 0  . meloxicam (MOBIC) 15 MG tablet Take 1 tablet (15 mg total) by mouth daily. With food for ten days and then as needed. 30 tablet 0  . telmisartan (MICARDIS) 80 MG tablet Take 1 tablet (80 mg total) by mouth daily. 90 tablet 1  . traMADol (ULTRAM) 50 MG tablet Take one or two at night as needed. 15  tablet 0   No facility-administered medications prior to visit.     Allergies  Allergen Reactions  . Penicillin G Other (See Comments) and Rash    Hives  . Penicillins Itching and Rash    ROS Review of Systems  Constitutional: Negative.   HENT: Negative.   Eyes: Negative for photophobia and visual disturbance.  Respiratory: Negative.   Cardiovascular: Negative.   Gastrointestinal: Negative.   Endocrine: Negative for polyphagia and polyuria.  Genitourinary: Negative for difficulty urinating, frequency, hematuria and urgency.  Musculoskeletal: Negative for arthralgias and myalgias.  Allergic/Immunologic: Negative for immunocompromised state.  Neurological: Negative for light-headedness and headaches.  Hematological: Does not bruise/bleed easily.  Psychiatric/Behavioral: Negative.       Objective:    Physical Exam  Constitutional: He is oriented to person, place, and time. He appears  well-developed and well-nourished. No distress.  HENT:  Head: Normocephalic and atraumatic.  Right Ear: External ear normal.  Left Ear: External ear normal.  Mouth/Throat: Oropharynx is clear and moist. No oropharyngeal exudate.  Eyes: Conjunctivae are normal. Right eye exhibits no discharge. Left eye exhibits no discharge. No scleral icterus.  Neck: No JVD present. No tracheal deviation present. No thyromegaly present.  Cardiovascular: Normal rate, regular rhythm and normal heart sounds.  Pulmonary/Chest: Effort normal and breath sounds normal. No stridor.  Abdominal: Bowel sounds are normal. He exhibits no distension. There is no abdominal tenderness. There is no rebound and no guarding.  Genitourinary: Rectum:     Guaiac result negative.     No rectal mass, anal fissure, tenderness, external hemorrhoid, internal hemorrhoid or abnormal anal tone.  Prostate is not enlarged and not tender.  Musculoskeletal:        General: No edema.  Lymphadenopathy:    He has no cervical adenopathy.  Neurological: He is alert and oriented to person, place, and time.  Skin: Skin is warm and dry. He is not diaphoretic.  Psychiatric: He has a normal mood and affect. His behavior is normal.    BP 130/78   Pulse 98   Ht 5\' 11"  (1.803 m)   Wt 245 lb (111.1 kg)   SpO2 98%   BMI 34.17 kg/m  Wt Readings from Last 3 Encounters:  10/30/18 245 lb (111.1 kg)  08/22/18 237 lb (107.5 kg)  06/23/18 241 lb 12.8 oz (109.7 kg)   BP Readings from Last 3 Encounters:  10/30/18 130/78  08/22/18 119/77  06/23/18 118/80   Guideline developer:  UpToDate (see UpToDate for funding source) Date Released: June 2014  Health Maintenance Due  Topic Date Due  . HIV Screening  12/19/1980  . COLONOSCOPY  12/20/2015    There are no preventive care reminders to display for this patient.  Lab Results  Component Value Date   TSH 2.31 07/18/2017   Lab Results  Component Value Date   WBC 7.8 09/03/2018   HGB 14.4  09/03/2018   HCT 41.1 09/03/2018   MCV 90.3 09/03/2018   PLT 346 09/03/2018   Lab Results  Component Value Date   NA 136 04/25/2018   K 3.9 04/25/2018   CO2 24 04/25/2018   GLUCOSE 86 04/25/2018   BUN 15 04/25/2018   CREATININE 1.06 04/25/2018   BILITOT 0.4 04/25/2018   ALKPHOS 79 07/18/2017   AST 17 04/25/2018   ALT 22 04/25/2018   PROT 8.6 (H) 04/25/2018   ALBUMIN 4.4 07/18/2017   CALCIUM 9.5 04/25/2018   GFR 72.55 07/18/2017   Lab Results  Component Value Date  CHOL 165 07/18/2017   Lab Results  Component Value Date   HDL 51.30 07/18/2017   Lab Results  Component Value Date   LDLCALC 96 07/18/2017   Lab Results  Component Value Date   TRIG 86.0 07/18/2017   Lab Results  Component Value Date   CHOLHDL 3 07/18/2017   No results found for: HGBA1C    Assessment & Plan:   Problem List Items Addressed This Visit      Cardiovascular and Mediastinum   Essential hypertension - Primary   Relevant Orders   Comprehensive metabolic panel   Urinalysis, Routine w reflex microscopic     Other   Elevated uric acid in blood   Relevant Orders   Uric acid    Other Visit Diagnoses    Gout, unspecified cause, unspecified chronicity, unspecified site       Relevant Orders   Comprehensive metabolic panel   Uric acid   Healthcare maintenance       Relevant Orders   Lipid panel   PSA      No orders of the defined types were placed in this encounter.   Follow-up: Return in about 6 months (around 05/02/2019), or if symptoms worsen or fail to improve.    Patient was given information on health maintenance and disease prevention.  He will continue to exercise and try to lose some weight.  We will continue his allopurinol.  Stressed the importance of uric acid control to prevent further gouty attacks.  Will consider getting back into the gym when things are safe.

## 2018-10-30 NOTE — Patient Instructions (Signed)

## 2018-10-31 LAB — LIPID PANEL
Chol/HDL Ratio: 3.4 ratio (ref 0.0–5.0)
Cholesterol, Total: 163 mg/dL (ref 100–199)
HDL: 48 mg/dL (ref >39)
LDL Calculated: 94 mg/dL (ref 0–99)
Triglycerides: 106 mg/dL (ref 0–149)
VLDL Cholesterol Cal: 21 mg/dL (ref 5–40)

## 2018-10-31 LAB — COMPREHENSIVE METABOLIC PANEL
ALT: 46 IU/L — ABNORMAL HIGH (ref 0–44)
AST: 28 IU/L (ref 0–40)
Albumin/Globulin Ratio: 1.5 (ref 1.2–2.2)
Albumin: 4.4 g/dL (ref 3.8–4.9)
Alkaline Phosphatase: 82 IU/L (ref 39–117)
BUN/Creatinine Ratio: 15 (ref 9–20)
BUN: 18 mg/dL (ref 6–24)
Bilirubin Total: 0.4 mg/dL (ref 0.0–1.2)
CO2: 23 mmol/L (ref 20–29)
Calcium: 9.5 mg/dL (ref 8.7–10.2)
Chloride: 105 mmol/L (ref 96–106)
Creatinine, Ser: 1.18 mg/dL (ref 0.76–1.27)
GFR calc Af Amer: 82 mL/min/{1.73_m2} (ref >59)
GFR calc non Af Amer: 71 mL/min/{1.73_m2} (ref >59)
Globulin, Total: 3 g/dL (ref 1.5–4.5)
Glucose: 117 mg/dL — ABNORMAL HIGH (ref 65–99)
Potassium: 4.2 mmol/L (ref 3.5–5.2)
Sodium: 141 mmol/L (ref 134–144)
Total Protein: 7.4 g/dL (ref 6.0–8.5)

## 2018-10-31 LAB — URINALYSIS, ROUTINE W REFLEX MICROSCOPIC
Bilirubin Urine: NEGATIVE
Glucose, UA: NEGATIVE
Hgb urine dipstick: NEGATIVE
Ketones, ur: NEGATIVE
Leukocytes,Ua: NEGATIVE
Nitrite: NEGATIVE
Protein, ur: NEGATIVE
Specific Gravity, Urine: 1.021 (ref 1.001–1.03)
pH: <=5 (ref 5.0–8.0)

## 2018-10-31 LAB — PSA: Prostate Specific Ag, Serum: 0.3 ng/mL (ref 0.0–4.0)

## 2018-10-31 LAB — URIC ACID: Uric Acid: 7.1 mg/dL (ref 3.7–8.6)

## 2018-11-01 ENCOUNTER — Encounter: Payer: Self-pay | Admitting: Family Medicine

## 2018-11-01 MED ORDER — ALLOPURINOL 300 MG PO TABS: 300.0000 mg | ORAL_TABLET | Freq: Every day | ORAL | 5 refills | Status: DC

## 2018-11-01 NOTE — Addendum Note (Signed)
Addended by: Jon Billings on: 11/01/2018 08:40 AM   Modules accepted: Orders

## 2018-12-18 ENCOUNTER — Other Ambulatory Visit: Payer: Self-pay | Admitting: Family Medicine

## 2018-12-18 DIAGNOSIS — M1000 Idiopathic gout, unspecified site: Secondary | ICD-10-CM

## 2019-05-04 ENCOUNTER — Ambulatory Visit: Payer: BC Managed Care – PPO | Admitting: Family Medicine

## 2019-07-21 ENCOUNTER — Other Ambulatory Visit: Payer: Self-pay

## 2019-07-22 ENCOUNTER — Encounter: Payer: Self-pay | Admitting: Family Medicine

## 2019-07-22 ENCOUNTER — Ambulatory Visit (INDEPENDENT_AMBULATORY_CARE_PROVIDER_SITE_OTHER): Payer: BC Managed Care – PPO | Admitting: Family Medicine

## 2019-07-22 VITALS — BP 130/78 | HR 86 | Temp 97.0°F | Ht 71.0 in | Wt 260.6 lb

## 2019-07-22 DIAGNOSIS — M109 Gout, unspecified: Secondary | ICD-10-CM

## 2019-07-22 DIAGNOSIS — R748 Abnormal levels of other serum enzymes: Secondary | ICD-10-CM | POA: Diagnosis not present

## 2019-07-22 DIAGNOSIS — M1000 Idiopathic gout, unspecified site: Secondary | ICD-10-CM | POA: Diagnosis not present

## 2019-07-22 DIAGNOSIS — I10 Essential (primary) hypertension: Secondary | ICD-10-CM

## 2019-07-22 DIAGNOSIS — K760 Fatty (change of) liver, not elsewhere classified: Secondary | ICD-10-CM

## 2019-07-22 MED ORDER — INDOMETHACIN 50 MG PO CAPS: ORAL_CAPSULE | ORAL | 0 refills | Status: DC

## 2019-07-22 MED ORDER — ALLOPURINOL 300 MG PO TABS: 300.0000 mg | ORAL_TABLET | Freq: Every day | ORAL | 1 refills | Status: DC

## 2019-07-22 MED ORDER — COLCHICINE 0.6 MG PO TABS: 0.6000 mg | ORAL_TABLET | Freq: Two times a day (BID) | ORAL | 2 refills | Status: DC

## 2019-07-22 MED ORDER — AMLODIPINE BESYLATE 10 MG PO TABS: 10.0000 mg | ORAL_TABLET | Freq: Every day | ORAL | 1 refills | Status: DC

## 2019-07-22 MED ORDER — TELMISARTAN 80 MG PO TABS: 80.0000 mg | ORAL_TABLET | Freq: Every day | ORAL | 1 refills | Status: DC

## 2019-07-22 NOTE — Progress Notes (Addendum)
New Patient Office Visit  Subjective:  Patient ID: Russell Franco, male    DOB: 03-31-1966  Age: 54 y.o. MRN: [id number]  CC:  Chief Complaint  Patient presents with  . Follow-up    Refill/follow up on medication, no concerns.     HPI Russell Franco presents for follow-up of his gout and blood pressure.  Hypertension is well controlled with the telmisartan.  He has not had any further gouty flares status post increase of .g. is having no issues taking these medications.  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  Would prefer to wait for the [place].  Has been exercising by walking.  Past Medical History:  Diagnosis Date  . Hypertension     History reviewed. No pertinent surgical history.  History reviewed. No pertinent family history.  Social History   Socioeconomic History  . Marital status: Single    Spouse name: Not on file  . Number of children: Not on file  . Years of education: Not on file  . Highest education level: Not on file  Occupational History  . Not on file  Tobacco Use  . Smoking status: Never Smoker  . Smokeless tobacco: Never Used  Substance and Sexual Activity  . Alcohol use: Never  . Drug use: Not on file  . Sexual activity: Not on file  Other Topics Concern  . Not on file  Social History Narrative  . Not on file   Social Determinants of Health   Financial Resource Strain:   . Difficulty of Paying Living Expenses:   Food Insecurity:   . Worried About Charity fundraiser in the Last Year:   . Arboriculturist in the Last Year:   Transportation Needs:   . Film/video editor (Medical):   Marland Kitchen Lack of Transportation (Non-Medical):   Physical Activity:   . Days of Exercise per Week:   . Minutes of Exercise per Session:   Stress:   . Feeling of Stress :   Social Connections:   . Frequency of Communication with Friends and Family:   . Frequency of Social Gatherings with Friends and  Family:   . Attends Religious Services:   . Active Member of Clubs or Organizations:   . Attends Archivist Meetings:   Marland Kitchen Marital Status:   Intimate Partner Violence:   . Fear of Current or Ex-Partner:   . Emotionally Abused:   Marland Kitchen Physically Abused:   . Sexually Abused:     ROS Review of Systems  Constitutional: Negative.   HENT: Negative.   Eyes: Negative for photophobia and visual disturbance.  Respiratory: Negative.   Cardiovascular: Negative.   Genitourinary: Negative.   Musculoskeletal: Negative for arthralgias.  Neurological: Negative.   Hematological: Does not bruise/bleed easily.  Psychiatric/Behavioral: Negative.     Objective:   Today's Vitals: BP 130/78   Pulse 86   Temp (!) 97 F (36.1 C) (Tympanic)   Ht 5\' 11"  (1.803 m)   Wt 260 lb 9.6 oz (118.2 kg)   SpO2 97%   BMI 36.35 kg/m   Physical Exam Vitals and nursing note reviewed.  Constitutional:      General: He is not in acute distress.    Appearance: Normal appearance. He is not ill-appearing, toxic-appearing or diaphoretic.  HENT:     Head: Normocephalic and atraumatic.  Cardiovascular:     Rate and Rhythm: Normal rate and regular rhythm.  Pulmonary:     Effort: Pulmonary effort is normal.     Breath sounds: Normal breath sounds.  Neurological:     Mental Status: He is alert and oriented to person, place, and time.  Psychiatric:        Mood and Affect: Mood normal.        Behavior: Behavior normal.     Assessment & Plan:   Problem List Items Addressed This Visit      Cardiovascular and Mediastinum   Essential hypertension - Primary   Relevant Medications   amLODipine (NORVASC) 10 MG tablet   telmisartan (MICARDIS) 80 MG tablet   Other Relevant Orders   Comprehensive metabolic panel (Completed)    Other Visit Diagnoses    Gout, unspecified cause, unspecified chronicity, unspecified site       Relevant Medications   allopurinol (ZYLOPRIM) 300 MG tablet   Other Relevant  Orders   Uric acid (Completed)   Comprehensive metabolic panel (Completed)   Acute idiopathic gout, unspecified site       Relevant Medications   colchicine 0.6 MG tablet   indomethacin (INDOCIN) 50 MG capsule   Elevated liver enzymes       Relevant Orders   US Abdomen Complete   Gamma GT   Hepatic function panel      Outpatient Encounter Medications as of 07/22/2019  Medication Sig  . allopurinol (ZYLOPRIM) 300 MG tablet Take 1 tablet (300 mg total) by mouth daily.  Marland Kitchen amLODipine (NORVASC) 10 MG tablet Take 1 tablet (10 mg total) by mouth daily.  . indomethacin (INDOCIN) 50 MG capsule TAKE ONE CAPSULE BY MOUTH THREE TIMES DAILY WITH FOOD AS NEEDED  . telmisartan (MICARDIS) 80 MG tablet Take 1 tablet (80 mg total) by mouth daily.  . [DISCONTINUED] allopurinol (ZYLOPRIM) 300 MG tablet Take 1 tablet (300 mg total) by mouth daily.  . [DISCONTINUED] amLODipine (NORVASC) 10 MG tablet Take 1 tablet (10 mg total) by mouth daily.  . [DISCONTINUED] indomethacin (INDOCIN) 50 MG capsule TAKE ONE CAPSULE BY MOUTH THREE TIMES DAILY WITH FOOD AS NEEDED  . [DISCONTINUED] telmisartan (MICARDIS) 80 MG tablet Take 1 tablet (80 mg total) by mouth daily.  . colchicine 0.6 MG tablet Take 1 tablet (0.6 mg total) by mouth 2 (two) times daily. As needed for attacks  . meloxicam (MOBIC) 15 MG tablet Take 1 tablet (15 mg total) by mouth daily. With food for ten days and then as needed. (Patient not taking: Reported on 07/22/2019)  . traMADol (ULTRAM) 50 MG tablet Take one or two at night as needed. (Patient not taking: Reported on 07/22/2019)  . [DISCONTINUED] colchicine 0.6 MG tablet Take 1 tablet (0.6 mg total) by mouth 2 (two) times daily. As needed for attacks (Patient not taking: Reported on 07/22/2019)   No facility-administered encounter medications on file as of 07/22/2019.    Follow-up: Return in about 6 months (around 01/22/2020).   [name], MD   3/36 addendum: Discussed elevated liver  enzymes with patient.  He has gained 20 pounds since his last office visit.  History of fatty liver disease.  Believe that the elevation is more to do with the weight gain then with allopurinol.  He has not had any further gouty attacks with his current dose of allopurinol.  Have ordered an ultrasound as well as a future liver function testing.  We will continue allopurinol for now.  He will come in in the next month or so to have the blood work  redrawn.  He will try to lose some weight.

## 2019-07-23 ENCOUNTER — Ambulatory Visit: Payer: BC Managed Care – PPO | Admitting: Family Medicine

## 2019-07-23 ENCOUNTER — Other Ambulatory Visit: Payer: Self-pay

## 2019-07-23 ENCOUNTER — Other Ambulatory Visit (INDEPENDENT_AMBULATORY_CARE_PROVIDER_SITE_OTHER): Payer: BC Managed Care – PPO

## 2019-07-23 DIAGNOSIS — I10 Essential (primary) hypertension: Secondary | ICD-10-CM

## 2019-07-23 DIAGNOSIS — M109 Gout, unspecified: Secondary | ICD-10-CM | POA: Diagnosis not present

## 2019-07-23 LAB — COMPREHENSIVE METABOLIC PANEL
ALT: 59 U/L — ABNORMAL HIGH (ref 0–53)
AST: 44 U/L — ABNORMAL HIGH (ref 0–37)
Albumin: 4.2 g/dL (ref 3.5–5.2)
Alkaline Phosphatase: 104 U/L (ref 39–117)
BUN: 13 mg/dL (ref 6–23)
CO2: 27 mEq/L (ref 19–32)
Calcium: 9 mg/dL (ref 8.4–10.5)
Chloride: 104 mEq/L (ref 96–112)
Creatinine, Ser: 1.08 mg/dL (ref 0.40–1.50)
GFR: 71.36 mL/min (ref >60.00)
Glucose, Bld: 98 mg/dL (ref 70–99)
Potassium: 3.9 mEq/L (ref 3.5–5.1)
Sodium: 137 mEq/L (ref 135–145)
Total Bilirubin: 0.4 mg/dL (ref 0.2–1.2)
Total Protein: 7.5 g/dL (ref 6.0–8.3)

## 2019-07-23 LAB — URIC ACID: Uric Acid, Serum: 7.3 mg/dL (ref 4.0–7.8)

## 2019-07-24 ENCOUNTER — Encounter: Payer: Self-pay | Admitting: Family Medicine

## 2019-07-24 NOTE — Addendum Note (Signed)
Addended by: Andrez Grime on: 07/24/2019 01:17 PM   Modules accepted: Orders

## 2019-07-29 ENCOUNTER — Other Ambulatory Visit: Payer: Self-pay | Admitting: Family Medicine

## 2019-07-29 DIAGNOSIS — I10 Essential (primary) hypertension: Secondary | ICD-10-CM

## 2019-07-29 DIAGNOSIS — M1000 Idiopathic gout, unspecified site: Secondary | ICD-10-CM

## 2019-07-31 ENCOUNTER — Encounter: Payer: Self-pay | Admitting: Family Medicine

## 2019-08-03 ENCOUNTER — Telehealth: Payer: Self-pay

## 2019-08-03 DIAGNOSIS — M109 Gout, unspecified: Secondary | ICD-10-CM

## 2019-08-03 MED ORDER — ALLOPURINOL 100 MG PO TABS: ORAL_TABLET | ORAL | 1 refills | Status: DC

## 2019-08-03 NOTE — Telephone Encounter (Signed)
Okay to do that. Please indicate this in the chart. Please tell him to directly let me know about this next time.

## 2019-08-03 NOTE — Telephone Encounter (Signed)
Pt has been taking Allopurinol 100mg  1qd and it has been working well for him/he has never gone up to the 300mg  that was sent in back in July/Per [facility]K ok to Nikky Duba/C 300mg /Start 100mg  to pharmacy/thx dmf

## 2019-08-06 ENCOUNTER — Other Ambulatory Visit: Payer: Self-pay

## 2019-08-06 ENCOUNTER — Ambulatory Visit (HOSPITAL_BASED_OUTPATIENT_CLINIC_OR_DEPARTMENT_OTHER)
Admission: RE | Admit: 2019-08-06 | Discharge: 2019-08-06 | Disposition: A | Discharge status: Home / Self Care | Payer: BC Managed Care – PPO | Source: Ambulatory Visit | Attending: Family Medicine | Admitting: Family Medicine

## 2019-08-06 DIAGNOSIS — R748 Abnormal levels of other serum enzymes: Secondary | ICD-10-CM | POA: Insufficient documentation

## 2019-08-10 DIAGNOSIS — K760 Fatty (change of) liver, not elsewhere classified: Secondary | ICD-10-CM | POA: Insufficient documentation

## 2019-08-10 DIAGNOSIS — R748 Abnormal levels of other serum enzymes: Secondary | ICD-10-CM | POA: Insufficient documentation

## 2020-01-22 ENCOUNTER — Ambulatory Visit: Payer: BC Managed Care – PPO | Admitting: Family Medicine

## 2020-02-17 ENCOUNTER — Other Ambulatory Visit: Payer: Self-pay

## 2020-02-18 ENCOUNTER — Ambulatory Visit: Payer: BC Managed Care – PPO | Admitting: Family Medicine

## 2020-02-18 ENCOUNTER — Encounter: Payer: Self-pay | Admitting: Family Medicine

## 2020-02-18 VITALS — BP 138/72 | HR 80 | Temp 98.2°F | Ht 71.0 in | Wt 232.0 lb

## 2020-02-18 DIAGNOSIS — Z23 Encounter for immunization: Secondary | ICD-10-CM | POA: Diagnosis not present

## 2020-02-18 DIAGNOSIS — I10 Essential (primary) hypertension: Secondary | ICD-10-CM | POA: Diagnosis not present

## 2020-02-18 DIAGNOSIS — M109 Gout, unspecified: Secondary | ICD-10-CM | POA: Diagnosis not present

## 2020-02-18 DIAGNOSIS — M1000 Idiopathic gout, unspecified site: Secondary | ICD-10-CM

## 2020-02-18 DIAGNOSIS — K760 Fatty (change of) liver, not elsewhere classified: Secondary | ICD-10-CM | POA: Diagnosis not present

## 2020-02-18 DIAGNOSIS — J4520 Mild intermittent asthma, uncomplicated: Secondary | ICD-10-CM

## 2020-02-18 DIAGNOSIS — J45909 Unspecified asthma, uncomplicated: Secondary | ICD-10-CM | POA: Insufficient documentation

## 2020-02-18 DIAGNOSIS — Z0000 Encounter for general adult medical examination without abnormal findings: Secondary | ICD-10-CM | POA: Diagnosis not present

## 2020-02-18 MED ORDER — ALLOPURINOL 100 MG PO TABS: ORAL_TABLET | ORAL | 2 refills | Status: DC

## 2020-02-18 MED ORDER — TELMISARTAN 80 MG PO TABS: ORAL_TABLET | ORAL | 2 refills | Status: DC

## 2020-02-18 MED ORDER — PREDNISONE 10 MG PO TABS: 10.0000 mg | ORAL_TABLET | Freq: Two times a day (BID) | ORAL | 0 refills | Status: AC

## 2020-02-18 MED ORDER — AMLODIPINE BESYLATE 10 MG PO TABS: ORAL_TABLET | ORAL | 2 refills | Status: DC

## 2020-02-18 MED ORDER — COLCHICINE 0.6 MG PO TABS: 0.6000 mg | ORAL_TABLET | Freq: Two times a day (BID) | ORAL | 2 refills | Status: DC

## 2020-02-18 NOTE — Progress Notes (Addendum)
Established Patient Office Visit  Subjective:  Patient ID: Russell Franco, male    DOB: [birth year]  Age: 54 y.o. MRN: [id number]  CC:  Chief Complaint  Patient presents with   Follow-up    6 month follow up, concerns about COVID.     HPI [name] presents for follow-up of hypertension and gout, physical exam and follow-up of a recent Covid infection.  Patient had the [employer] vaccine back in April.  He developed Covid symptoms and tested positive back on 926.  He did well with the illness.  He has only been left with a lingering cough.  Denies fever, sputum production or loss of taste or smell.  Cough is worse in the evening.  Denies shortness of breath or difficulty breathing.  Continues amlodipine and telmisartan for blood pressure that is well controlled.  Continues low-dose amlodipine and as needed colchicine and Indocin for gout.  Intentional weight loss of almost 28 pounds!  Past Medical History:  Diagnosis Date   Hypertension     History reviewed. No pertinent surgical history.  History reviewed. No pertinent family history.  Social History   Socioeconomic History   Marital status: Married    Spouse name: Not on file   Number of children: Not on file   Years of education: Not on file   Highest education level: Not on file  Occupational History   Not on file  Tobacco Use   Smoking status: Never Smoker   Smokeless tobacco: Never Used  Substance and Sexual Activity   Alcohol use: Never   Drug use: Not on file   Sexual activity: Not on file  Other Topics Concern   Not on file  Social History Narrative   Not on file   Social Determinants of Health   Financial Resource Strain:    Difficulty of Paying Living Expenses: Not on file  Food Insecurity:    Worried About Running Out of Food in the Last Year: Not on file   Ran Out of Food in the Last Year: Not on file  Transportation Needs:    Lack of Transportation (Medical): Not  on file   Lack of Transportation (Non-Medical): Not on file  Physical Activity:    Days of Exercise per Week: Not on file   Minutes of Exercise per Session: Not on file  Stress:    Feeling of Stress : Not on file  Social Connections:    Frequency of Communication with Friends and Family: Not on file   Frequency of Social Gatherings with Friends and Family: Not on file   Attends Religious Services: Not on file   Active Member of Clubs or Organizations: Not on file   Attends Banker Meetings: Not on file   Marital Status: Not on file  Intimate Partner Violence:    Fear of Current or Ex-Partner: Not on file   Emotionally Abused: Not on file   Physically Abused: Not on file   Sexually Abused: Not on file    Outpatient Medications Prior to Visit  Medication Sig Dispense Refill   indomethacin (INDOCIN) 50 MG capsule TAKE 1 CAPSULE BY MOUTH THREE TIMES DAILY WITH FOOD AS NEEDED 60 capsule 0   allopurinol (ZYLOPRIM) 100 MG tablet Take 1qd 90 tablet 1   amLODipine (NORVASC) 10 MG tablet TAKE 1 TABLET(10 MG) BY MOUTH DAILY 90 tablet 1   colchicine 0.6 MG tablet Take 1 tablet (0.6 mg total) by mouth 2 (two) times daily.  As needed for attacks 60 tablet 2   telmisartan (MICARDIS) 80 MG tablet TAKE 1 TABLET(80 MG) BY MOUTH DAILY 90 tablet 1   amLODipine (NORVASC) 10 MG tablet TAKE 1 TABLET(10 MG) BY MOUTH DAILY (Patient not taking: Reported on 02/18/2020) 90 tablet 1   indomethacin (INDOCIN) 50 MG capsule TAKE 1 CAPSULE BY MOUTH THREE TIMES DAILY WITH FOOD AS NEEDED (Patient not taking: Reported on 02/18/2020) 60 capsule 0   indomethacin (INDOCIN) 50 MG capsule TAKE 1 CAPSULE BY MOUTH THREE TIMES DAILY WITH FOOD AS NEEDED (Patient not taking: Reported on 02/18/2020) 60 capsule 0   traMADol (ULTRAM) 50 MG tablet Take one or two at night as needed. (Patient not taking: Reported on 07/22/2019) 15 tablet 0   No facility-administered medications prior to visit.     Allergies  Allergen Reactions   Penicillin G Other (See Comments) and Rash    Hives   Penicillins Itching and Rash    ROS Review of Systems  Constitutional: Negative for chills, diaphoresis, fatigue, fever and unexpected weight change.  HENT: Negative.   Eyes: Negative for photophobia and visual disturbance.  Respiratory: Positive for cough. Negative for shortness of breath and wheezing.   Cardiovascular: Negative.   Gastrointestinal: Negative.   Endocrine: Negative for polyphagia and polyuria.  Genitourinary: Negative.   Musculoskeletal: Negative for gait problem and joint swelling.  Skin: Negative for pallor and rash.  Allergic/Immunologic: Negative for immunocompromised state.  Neurological: Negative for light-headedness and numbness.  Hematological: Does not bruise/bleed easily.  Psychiatric/Behavioral: Negative.       Objective:    Physical Exam Vitals and nursing note reviewed.  Constitutional:      General: He is not in acute distress.    Appearance: Normal appearance. He is not ill-appearing, toxic-appearing or diaphoretic.  HENT:     Head: Normocephalic and atraumatic.     Right Ear: Tympanic membrane, ear canal and external ear normal.     Left Ear: Tympanic membrane, ear canal and external ear normal.  Eyes:     General: No scleral icterus.       Right eye: No discharge.        Left eye: No discharge.     Extraocular Movements: Extraocular movements intact.     Conjunctiva/sclera: Conjunctivae normal.     Pupils: Pupils are equal, round, and reactive to light.  Cardiovascular:     Rate and Rhythm: Normal rate and regular rhythm.  Pulmonary:     Effort: Pulmonary effort is normal. No respiratory distress.     Breath sounds: Normal breath sounds. No stridor. No wheezing or rhonchi.  Abdominal:     General: Bowel sounds are normal.  Musculoskeletal:     Cervical back: No rigidity or tenderness.     Right lower leg: No edema.     Left lower leg: No  edema.  Lymphadenopathy:     Cervical: No cervical adenopathy.  Skin:    General: Skin is warm and dry.  Neurological:     Mental Status: He is alert and oriented to person, place, and time.  Psychiatric:        Mood and Affect: Mood normal.        Behavior: Behavior normal.     BP 138/72    Pulse 80    Temp 98.2 F (36.8 C) (Tympanic)    Ht 5\' 11"  (1.803 m)    Wt 232 lb (105.2 kg)    SpO2 97%    BMI 32.36 kg/m  Wt Readings from Last 3 Encounters:  02/18/20 232 lb (105.2 kg)  07/22/19 260 lb 9.6 oz (118.2 kg)  10/30/18 245 lb (111.1 kg)     Health Maintenance Due  Topic Date Due   Hepatitis C Screening  Never done   HIV Screening  Never done   COLONOSCOPY  Never done    There are no preventive care reminders to display for this patient.  Lab Results  Component Value Date   TSH 2.31 07/18/2017   Lab Results  Component Value Date   WBC 6.5 02/18/2020   HGB 13.9 02/18/2020   HCT 40.9 02/18/2020   MCV 96.4 02/18/2020   PLT 305.0 02/18/2020   Lab Results  Component Value Date   NA 138 02/18/2020   K 4.0 02/18/2020   CO2 27 02/18/2020   GLUCOSE 95 02/18/2020   BUN 16 02/18/2020   CREATININE 1.13 02/18/2020   BILITOT 0.7 02/18/2020   ALKPHOS 88 02/18/2020   AST 27 02/18/2020   ALT 19 02/18/2020   PROT 7.4 02/18/2020   ALBUMIN 4.3 02/18/2020   CALCIUM 9.2 02/18/2020   GFR 73.86 02/18/2020   Lab Results  Component Value Date   CHOL 163 02/18/2020   Lab Results  Component Value Date   HDL 56.50 02/18/2020   Lab Results  Component Value Date   LDLCALC 91 02/18/2020   Lab Results  Component Value Date   TRIG 76.0 02/18/2020   Lab Results  Component Value Date   CHOLHDL 3 02/18/2020   No results found for: HGBA1C    Assessment & Plan:   Problem List Items Addressed This Visit      Cardiovascular and Mediastinum   Essential hypertension   Relevant Medications   amLODipine (NORVASC) 10 MG tablet   telmisartan (MICARDIS) 80 MG tablet    Other Relevant Orders   CBC (Completed)   Comprehensive metabolic panel (Completed)   Urinalysis, Routine w reflex microscopic (Completed)     Respiratory   Reactive airway disease   Relevant Medications   predniSONE (DELTASONE) 10 MG tablet     Digestive   Hepatic steatosis   Relevant Orders   Comprehensive metabolic panel (Completed)   Lipid panel (Completed)     Other   Acute idiopathic gout   Relevant Medications   colchicine 0.6 MG tablet   Gout   Relevant Medications   allopurinol (ZYLOPRIM) 100 MG tablet   Other Relevant Orders   Comprehensive metabolic panel (Completed)   Uric acid (Completed)   Healthcare maintenance   Relevant Orders   Lipid panel (Completed)   PSA (Completed)   Need for influenza vaccination - Primary   Relevant Orders   Flu Vaccine QUAD 6+ mos PF IM (Fluarix Quad PF) (Completed)      Meds ordered this encounter  Medications   predniSONE (DELTASONE) 10 MG tablet    Sig: Take 1 tablet (10 mg total) by mouth 2 (two) times daily with a meal for 5 days.    Dispense:  10 tablet    Refill:  0   amLODipine (NORVASC) 10 MG tablet    Sig: TAKE 1 TABLET(10 MG) BY MOUTH DAILY    Dispense:  90 tablet    Refill:  2   colchicine 0.6 MG tablet    Sig: Take 1 tablet (0.6 mg total) by mouth 2 (two) times daily. As needed for attacks    Dispense:  60 tablet    Refill:  2   telmisartan (MICARDIS) 80  MG tablet    Sig: TAKE 1 TABLET(80 MG) BY MOUTH DAILY    Dispense:  90 tablet    Refill:  2   DISCONTD: allopurinol (ZYLOPRIM) 100 MG tablet    Sig: Take 1qd    Dispense:  90 tablet    Refill:  2    D/C 300mg /Start 100mg    allopurinol (ZYLOPRIM) 100 MG tablet    Sig: Take 2 tablets (200 mg total) by mouth daily.    Dispense:  180 tablet    Refill:  3    Follow-up: Return in about 6 months (around 08/18/2020), or continue weight loss efforts congratulations! , MD

## 2020-02-18 NOTE — Patient Instructions (Signed)
Health Maintenance, Male Adopting a healthy lifestyle and getting preventive care are important in promoting health and wellness. Ask your health care provider about:  The right schedule for you to have regular tests and exams.  Things you can do on your own to prevent diseases and keep yourself healthy. What should I know about diet, weight, and exercise? Eat a healthy diet   Eat a diet that includes plenty of vegetables, fruits, low-fat dairy products, and lean protein.  Do not eat a lot of foods that are high in solid fats, added sugars, or sodium. Maintain a healthy weight Body mass index (BMI) is a measurement that can be used to identify possible weight problems. It estimates body fat based on height and weight. Your health care provider can help determine your BMI and help you achieve or maintain a healthy weight. Get regular exercise Get regular exercise. This is one of the most important things you can do for your health. Most adults should:  Exercise for at least 150 minutes each week. The exercise should increase your heart rate and make you sweat (moderate-intensity exercise).  Do strengthening exercises at least twice a week. This is in addition to the moderate-intensity exercise.  Spend less time sitting. Even light physical activity can be beneficial. Watch cholesterol and blood lipids Have your blood tested for lipids and cholesterol at 54 years of age, then have this test every 5 years. You may need to have your cholesterol levels checked more often if:  Your lipid or cholesterol levels are high.  You are older than 54 years of age.  You are at high risk for heart disease. What should I know about cancer screening? Many types of cancers can be detected early and may often be prevented. Depending on your health history and family history, you may need to have cancer screening at various ages. This may include screening for:  Colorectal cancer.  Prostate  cancer.  Skin cancer.  Lung cancer. What should I know about heart disease, diabetes, and high blood pressure? Blood pressure and heart disease  High blood pressure causes heart disease and increases the risk of stroke. This is more likely to develop in people who have high blood pressure readings, are of African descent, or are overweight.  Talk with your health care provider about your target blood pressure readings.  Have your blood pressure checked: ? Every 3-5 years if you are 18-39 years of age. ? Every year if you are 40 years old or older.  If you are between the ages of 65 and 75 and are a current or former smoker, ask your health care provider if you should have a one-time screening for abdominal aortic aneurysm (AAA). Diabetes Have regular diabetes screenings. This checks your fasting blood sugar level. Have the screening done:  Once every three years after age 45 if you are at a normal weight and have a low risk for diabetes.  More often and at a younger age if you are overweight or have a high risk for diabetes. What should I know about preventing infection? Hepatitis B If you have a higher risk for hepatitis B, you should be screened for this virus. Talk with your health care provider to find out if you are at risk for hepatitis B infection. Hepatitis C Blood testing is recommended for:  Everyone born from 1945 through 1965.  Anyone with known risk factors for hepatitis C. Sexually transmitted infections (STIs)  You should be screened each year   for STIs, including gonorrhea and chlamydia, if: ? You are sexually active and are younger than 54 years of age. ? You are older than 54 years of age and your health care provider tells you that you are at risk for this type of infection. ? Your sexual activity has changed since you were last screened, and you are at increased risk for chlamydia or gonorrhea. Ask your health care provider if you are at risk.  Ask your  health care provider about whether you are at high risk for HIV. Your health care provider may recommend a prescription medicine to help prevent HIV infection. If you choose to take medicine to prevent HIV, you should first get tested for HIV. You should then be tested every 3 months for as long as you are taking the medicine. Follow these instructions at home: Lifestyle  Do not use any products that contain nicotine or tobacco, such as cigarettes, e-cigarettes, and chewing tobacco. If you need help quitting, ask your health care provider.  Do not use street drugs.  Do not share needles.  Ask your health care provider for help if you need support or information about quitting drugs. Alcohol use  Do not drink alcohol if your health care provider tells you not to drink.  If you drink alcohol: ? Limit how much you have to 0-2 drinks a day. ? Be aware of how much alcohol is in your drink. In the [place] one drink equals one 12 oz bottle of beer (355 mL), one 5 oz glass of wine (148 mL), or one 1 oz glass of hard liquor (44 mL). General instructions  Schedule regular health, dental, and eye exams.  Stay current with your vaccines.  Tell your health care provider if: ? You often feel depressed. ? You have ever been abused or do not feel safe at home. Summary  Adopting a healthy lifestyle and getting preventive care are important in promoting health and wellness.  Follow your health care provider's instructions about healthy diet, exercising, and getting tested or screened for diseases.  Follow your health care provider's instructions on monitoring your cholesterol and blood pressure. This information is not intended to replace advice given to you by your health care provider. Make sure you discuss any questions you have with your health care provider. Document Revised: 04/09/2018 Document Reviewed: 04/09/2018 Elsevier Patient Education  2020 [employer]  Preventive Care 40-64 Years  Old, Male Preventive care refers to lifestyle choices and visits with your health care provider that can promote health and wellness. This includes:  A yearly physical exam. This is also called an annual well check.  Regular dental and eye exams.  Immunizations.  Screening for certain conditions.  Healthy lifestyle choices, such as eating a healthy diet, getting regular exercise, not using drugs or products that contain nicotine and tobacco, and limiting alcohol use. What can I expect for my preventive care visit? Physical exam Your health care provider will check:  Height and weight. These may be used to calculate body mass index (BMI), which is a measurement that tells if you are at a healthy weight.  Heart rate and blood pressure.  Your skin for abnormal spots. Counseling Your health care provider may ask you questions about:  Alcohol, tobacco, and drug use.  Emotional well-being.  Home and relationship well-being.  Sexual activity.  Eating habits.  Work and work environment. What immunizations do I need?  Influenza (flu) vaccine  This is recommended every year. Tetanus, diphtheria,   and pertussis (Tdap) vaccine  You may need a Td booster every 10 years. Varicella (chickenpox) vaccine  You may need this vaccine if you have not already been vaccinated. Zoster (shingles) vaccine  You may need this after age 63. Measles, mumps, and rubella (MMR) vaccine  You may need at least one dose of MMR if you were born in 1957 or later. You may also need a second dose. Pneumococcal conjugate (PCV13) vaccine  You may need this if you have certain conditions and were not previously vaccinated. Pneumococcal polysaccharide (PPSV23) vaccine  You may need one or two doses if you smoke cigarettes or if you have certain conditions. Meningococcal conjugate (MenACWY) vaccine  You may need this if you have certain conditions. Hepatitis A vaccine  You may need this if you have  certain conditions or if you travel or work in places where you may be exposed to hepatitis A. Hepatitis B vaccine  You may need this if you have certain conditions or if you travel or work in places where you may be exposed to hepatitis B. Haemophilus influenzae type b (Hib) vaccine  You may need this if you have certain risk factors. Human papillomavirus (HPV) vaccine  If recommended by your health care provider, you may need three doses over 6 months. You may receive vaccines as individual doses or as more than one vaccine together in one shot (combination vaccines). Talk with your health care provider about the risks and benefits of combination vaccines. What tests do I need? Blood tests  Lipid and cholesterol levels. These may be checked every 5 years, or more frequently if you are over 68 years old.  Hepatitis C test.  Hepatitis B test. Screening  Lung cancer screening. You may have this screening every year starting at age 78 if you have a 30-pack-year history of smoking and currently smoke or have quit within the past 15 years.  Prostate cancer screening. Recommendations will vary depending on your family history and other risks.  Colorectal cancer screening. All adults should have this screening starting at age 38 and continuing until age 22. Your health care provider may recommend screening at age 73 if you are at increased risk. You will have tests every 1-10 years, depending on your results and the type of screening test.  Diabetes screening. This is done by checking your blood sugar (glucose) after you have not eaten for a while (fasting). You may have this done every 1-3 years.  Sexually transmitted disease (STD) testing. Follow these instructions at home: Eating and drinking  Eat a diet that includes fresh fruits and vegetables, whole grains, lean protein, and low-fat dairy products.  Take vitamin and mineral supplements as recommended by your health care  provider.  Do not drink alcohol if your health care provider tells you not to drink.  If you drink alcohol: ? Limit how much you have to 0-2 drinks a day. ? Be aware of how much alcohol is in your drink. In the [place] one drink equals one 12 oz bottle of beer (355 mL), one 5 oz glass of wine (148 mL), or one 1 oz glass of hard liquor (44 mL). Lifestyle  Take daily care of your teeth and gums.  Stay active. Exercise for at least 30 minutes on 5 or more days each week.  Do not use any products that contain nicotine or tobacco, such as cigarettes, e-cigarettes, and chewing tobacco. If you need help quitting, ask your health care provider.  If  you are sexually active, practice safe sex. Use a condom or other form of protection to prevent STIs (sexually transmitted infections).  Talk with your health care provider about taking a low-dose aspirin every day starting at age 50. What's next?  Go to your health care provider once a year for a well check visit.  Ask your health care provider how often you should have your eyes and teeth checked.  Stay up to date on all vaccines. This information is not intended to replace advice given to you by your health care provider. Make sure you discuss any questions you have with your health care provider. Document Revised: 04/10/2018 Document Reviewed: 04/10/2018 Elsevier Patient Education  2020 [employer].  

## 2020-02-19 LAB — LIPID PANEL
Cholesterol: 163 mg/dL (ref 0–200)
HDL: 56.5 mg/dL (ref >39.00)
LDL Cholesterol: 91 mg/dL (ref 0–99)
NonHDL: 106.13
Total CHOL/HDL Ratio: 3
Triglycerides: 76 mg/dL (ref 0.0–149.0)
VLDL: 15.2 mg/dL (ref 0.0–40.0)

## 2020-02-19 LAB — CBC
HCT: 40.9 % (ref 39.0–52.0)
Hemoglobin: 13.9 g/dL (ref 13.0–17.0)
MCHC: 33.9 g/dL (ref 30.0–36.0)
MCV: 96.4 fl (ref 78.0–100.0)
Platelets: 305 10*3/uL (ref 150.0–400.0)
RBC: 4.25 Mil/uL (ref 4.22–5.81)
RDW: 15.3 % (ref 11.5–15.5)
WBC: 6.5 10*3/uL (ref 4.0–10.5)

## 2020-02-19 LAB — COMPREHENSIVE METABOLIC PANEL
ALT: 19 U/L (ref 0–53)
AST: 27 U/L (ref 0–37)
Albumin: 4.3 g/dL (ref 3.5–5.2)
Alkaline Phosphatase: 88 U/L (ref 39–117)
BUN: 16 mg/dL (ref 6–23)
CO2: 27 mEq/L (ref 19–32)
Calcium: 9.2 mg/dL (ref 8.4–10.5)
Chloride: 105 mEq/L (ref 96–112)
Creatinine, Ser: 1.13 mg/dL (ref 0.40–1.50)
GFR: 73.86 mL/min (ref >60.00)
Glucose, Bld: 95 mg/dL (ref 70–99)
Potassium: 4 mEq/L (ref 3.5–5.1)
Sodium: 138 mEq/L (ref 135–145)
Total Bilirubin: 0.7 mg/dL (ref 0.2–1.2)
Total Protein: 7.4 g/dL (ref 6.0–8.3)

## 2020-02-19 LAB — URINALYSIS, ROUTINE W REFLEX MICROSCOPIC
Bilirubin Urine: NEGATIVE
Ketones, ur: NEGATIVE
Leukocytes,Ua: NEGATIVE
Nitrite: NEGATIVE
Specific Gravity, Urine: >=1.03 — AB (ref 1.000–1.030)
Total Protein, Urine: NEGATIVE
Urine Glucose: NEGATIVE
Urobilinogen, UA: 0.2 (ref 0.0–1.0)
pH: 5.5 (ref 5.0–8.0)

## 2020-02-19 LAB — URIC ACID: Uric Acid, Serum: 7.6 mg/dL (ref 4.0–7.8)

## 2020-02-19 LAB — PSA: PSA: 0.35 ng/mL (ref 0.10–4.00)

## 2020-02-22 MED ORDER — ALLOPURINOL 100 MG PO TABS: 200.0000 mg | ORAL_TABLET | Freq: Every day | ORAL | 3 refills | Status: DC

## 2020-02-22 NOTE — Addendum Note (Signed)
Addended by: Andrez Grime on: 02/22/2020 12:51 PM   Modules accepted: Orders

## 2020-08-19 ENCOUNTER — Ambulatory Visit: Payer: BC Managed Care – PPO | Admitting: Family Medicine

## 2020-09-23 ENCOUNTER — Ambulatory Visit: Payer: BC Managed Care – PPO | Admitting: Family Medicine

## 2020-09-23 ENCOUNTER — Other Ambulatory Visit: Payer: Self-pay

## 2020-09-23 ENCOUNTER — Encounter: Payer: Self-pay | Admitting: Family Medicine

## 2020-09-23 VITALS — BP 126/74 | HR 77 | Temp 97.9°F | Ht 71.0 in | Wt 238.6 lb

## 2020-09-23 DIAGNOSIS — Z0000 Encounter for general adult medical examination without abnormal findings: Secondary | ICD-10-CM

## 2020-09-23 DIAGNOSIS — I10 Essential (primary) hypertension: Secondary | ICD-10-CM | POA: Diagnosis not present

## 2020-09-23 DIAGNOSIS — M109 Gout, unspecified: Secondary | ICD-10-CM

## 2020-09-23 MED ORDER — TELMISARTAN 80 MG PO TABS: ORAL_TABLET | ORAL | 2 refills | Status: DC

## 2020-09-23 MED ORDER — ALLOPURINOL 100 MG PO TABS: 200.0000 mg | ORAL_TABLET | Freq: Every day | ORAL | 3 refills | Status: DC

## 2020-09-23 MED ORDER — AMLODIPINE BESYLATE 10 MG PO TABS: ORAL_TABLET | ORAL | 2 refills | Status: DC

## 2020-09-23 NOTE — Progress Notes (Signed)
Established Patient Office Visit  Subjective:  Patient ID: Russell Franco, male    DOB: Apr 05, 1966  Age: 55 y.o. MRN: [id number]  CC:  Chief Complaint  Patient presents with  . Follow-up    6 month follow up on medications, BP and labs. Fasting. No concerns.     HPI [name] presents for physical exam and follow-up of hypertension and gout.  Has been successful at losing 28 pounds.  He has been able to more or less keep his weight off.  Continues gym work and other physical activity.  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  Gout is well well controlled with allopurinol.  He has not needed colchicine or Indocin since we upped the dose.  Blood pressure remains well controlled on the amlodipine and telmisartan.  He denies lightheadedness with standing.  Past Medical History:  Diagnosis Date  . Hypertension     No past surgical history on file.  No family history on file.  Social History   Socioeconomic History  . Marital status: Married    Spouse name: Not on file  . Number of children: Not on file  . Years of education: Not on file  . Highest education level: Not on file  Occupational History  . Not on file  Tobacco Use  . Smoking status: Never Smoker  . Smokeless tobacco: Never Used  Substance and Sexual Activity  . Alcohol use: Never  . Drug use: Not on file  . Sexual activity: Not on file  Other Topics Concern  . Not on file  Social History Narrative  . Not on file   Social Determinants of Health   Financial Resource Strain: Not on file  Food Insecurity: Not on file  Transportation Needs: Not on file  Physical Activity: Not on file  Stress: Not on file  Social Connections: Not on file  Intimate Partner Violence: Not on file    Outpatient Medications Prior to Visit  Medication Sig Dispense Refill  . colchicine 0.6 MG tablet Take 1 tablet (0.6 mg total) by mouth 2 (two) times daily. As needed for attacks 60 tablet 2  . indomethacin (INDOCIN) 50  MG capsule TAKE 1 CAPSULE BY MOUTH THREE TIMES DAILY WITH FOOD AS NEEDED 60 capsule 0  . allopurinol (ZYLOPRIM) 100 MG tablet Take 2 tablets (200 mg total) by mouth daily. 180 tablet 3  . amLODipine (NORVASC) 10 MG tablet TAKE 1 TABLET(10 MG) BY MOUTH DAILY 90 tablet 2  . telmisartan (MICARDIS) 80 MG tablet TAKE 1 TABLET(80 MG) BY MOUTH DAILY 90 tablet 2   No facility-administered medications prior to visit.    Allergies  Allergen Reactions  . Penicillin G Other (See Comments) and Rash    Hives  . Penicillins Itching and Rash    ROS Review of Systems  Constitutional: Negative.   HENT: Negative.   Eyes: Negative for photophobia and visual disturbance.  Respiratory: Negative.   Cardiovascular: Negative.   Gastrointestinal: Negative.   Endocrine: Negative for polyphagia and polyuria.  Genitourinary: Negative for difficulty urinating, frequency and urgency.  Musculoskeletal: Negative for arthralgias.  Neurological: Negative for speech difficulty and weakness.  Hematological: Does not bruise/bleed easily.  Psychiatric/Behavioral: Negative.       Objective:    Physical Exam Vitals and nursing note reviewed.  Constitutional:      General: He is not in acute distress.    Appearance: Normal appearance. He is normal weight. He is not ill-appearing, toxic-appearing or diaphoretic.  HENT:     Head: Normocephalic and atraumatic.     Right Ear: Tympanic membrane, ear canal and external ear normal.     Left Ear: Tympanic membrane, ear canal and external ear normal.     Mouth/Throat:     Mouth: Mucous membranes are moist.     Pharynx: Oropharynx is clear. No oropharyngeal exudate or posterior oropharyngeal erythema.  Eyes:     Extraocular Movements: Extraocular movements intact.     Conjunctiva/sclera: Conjunctivae normal.     Pupils: Pupils are equal, round, and reactive to light.  Neck:     Vascular: No carotid bruit.  Cardiovascular:     Rate and Rhythm: Normal rate and  regular rhythm.  Pulmonary:     Effort: Pulmonary effort is normal.     Breath sounds: Normal breath sounds.  Abdominal:     General: Abdomen is flat. Bowel sounds are normal. There is no distension.     Palpations: Abdomen is soft. There is no mass.     Tenderness: There is no abdominal tenderness. There is no guarding or rebound.     Hernia: No hernia is present.  Genitourinary:    Prostate: Not enlarged, not tender and no nodules present.     Rectum: Guaiac result negative. No mass, tenderness, anal fissure, external hemorrhoid or internal hemorrhoid. Normal anal tone.  Musculoskeletal:     Cervical back: No rigidity or tenderness.  Lymphadenopathy:     Cervical: No cervical adenopathy.  Skin:    General: Skin is warm and dry.  Neurological:     Mental Status: He is alert and oriented to person, place, and time.  Psychiatric:        Mood and Affect: Mood normal.        Behavior: Behavior normal.     BP 126/74   Pulse 77   Temp 97.9 F (36.6 C) (Temporal)   Ht 5\' 11"  (1.803 m)   Wt 238 lb 9.6 oz (108.2 kg)   SpO2 98%   BMI 33.28 kg/m  Wt Readings from Last 3 Encounters:  09/23/20 238 lb 9.6 oz (108.2 kg)  02/18/20 232 lb (105.2 kg)  07/22/19 260 lb 9.6 oz (118.2 kg)     Health Maintenance Due  Topic Date Due  . HIV Screening  Never done  . Hepatitis C Screening  Never done  . COLONOSCOPY (Pts 45-13yrs Insurance coverage will need to be confirmed)  Never done  . Zoster Vaccines- Shingrix (1 of 2) Never done    There are no preventive care reminders to display for this patient.  Lab Results  Component Value Date   TSH 2.31 07/18/2017   Lab Results  Component Value Date   WBC 6.5 02/18/2020   HGB 13.9 02/18/2020   HCT 40.9 02/18/2020   MCV 96.4 02/18/2020   PLT 305.0 02/18/2020   Lab Results  Component Value Date   NA 138 02/18/2020   K 4.0 02/18/2020   CO2 27 02/18/2020   GLUCOSE 95 02/18/2020   BUN 16 02/18/2020   CREATININE 1.13 02/18/2020    BILITOT 0.7 02/18/2020   ALKPHOS 88 02/18/2020   AST 27 02/18/2020   ALT 19 02/18/2020   PROT 7.4 02/18/2020   ALBUMIN 4.3 02/18/2020   CALCIUM 9.2 02/18/2020   GFR 73.86 02/18/2020   Lab Results  Component Value Date   CHOL 163 02/18/2020   Lab Results  Component Value Date   HDL 56.50 02/18/2020   Lab Results  Component  Value Date   LDLCALC 91 02/18/2020   Lab Results  Component Value Date   TRIG 76.0 02/18/2020   Lab Results  Component Value Date   CHOLHDL 3 02/18/2020   No results found for: HGBA1C    Assessment & Plan:   Problem List Items Addressed This Visit      Cardiovascular and Mediastinum   Essential hypertension - Primary   Relevant Medications   amLODipine (NORVASC) 10 MG tablet   telmisartan (MICARDIS) 80 MG tablet   Other Relevant Orders   Basic metabolic panel   CBC   Urinalysis, Routine w reflex microscopic     Other   Gout   Relevant Medications   allopurinol (ZYLOPRIM) 100 MG tablet   Other Relevant Orders   Hepatic function panel   Uric acid   Healthcare maintenance   Relevant Orders   Ambulatory referral to Gastroenterology   Lipid panel   PSA      Meds ordered this encounter  Medications  . allopurinol (ZYLOPRIM) 100 MG tablet    Sig: Take 2 tablets (200 mg total) by mouth daily.    Dispense:  180 tablet    Refill:  3  . amLODipine (NORVASC) 10 MG tablet    Sig: TAKE 1 TABLET(10 MG) BY MOUTH DAILY    Dispense:  90 tablet    Refill:  2  . telmisartan (MICARDIS) 80 MG tablet    Sig: TAKE 1 TABLET(80 MG) BY MOUTH DAILY    Dispense:  90 tablet    Refill:  2    Follow-up: No follow-ups on file.  Patient will continue his weight loss efforts.  He will let me know if he develops lightheadedness or if his blood pressures start averaging less than 110/70.  Continue allopurinol.  Call for refills as needed for colchicine and Indocin.  Continue physical activity.  He will be due for his next colonoscopy after his birthday.   Continue allopurinol, telmisartan and amlodipine as currently directed.   [name], MD

## 2020-09-23 NOTE — Patient Instructions (Signed)
Health Maintenance, Male Adopting a healthy lifestyle and getting preventive care are important in promoting health and wellness. Ask your health care provider about:  The right schedule for you to have regular tests and exams.  Things you can do on your own to prevent diseases and keep yourself healthy. What should I know about diet, weight, and exercise? Eat a healthy diet  Eat a diet that includes plenty of vegetables, fruits, low-fat dairy products, and lean protein.  Do not eat a lot of foods that are high in solid fats, added sugars, or sodium.   Maintain a healthy weight Body mass index (BMI) is a measurement that can be used to identify possible weight problems. It estimates body fat based on height and weight. Your health care provider can help determine your BMI and help you achieve or maintain a healthy weight. Get regular exercise Get regular exercise. This is one of the most important things you can do for your health. Most adults should:  Exercise for at least 150 minutes each week. The exercise should increase your heart rate and make you sweat (moderate-intensity exercise).  Do strengthening exercises at least twice a week. This is in addition to the moderate-intensity exercise.  Spend less time sitting. Even light physical activity can be beneficial. Watch cholesterol and blood lipids Have your blood tested for lipids and cholesterol at 55 years of age, then have this test every 5 years. You may need to have your cholesterol levels checked more often if:  Your lipid or cholesterol levels are high.  You are older than 55 years of age.  You are at high risk for heart disease. What should I know about cancer screening? Many types of cancers can be detected early and may often be prevented. Depending on your health history and family history, you may need to have cancer screening at various ages. This may include screening for:  Colorectal cancer.  Prostate  cancer.  Skin cancer.  Lung cancer. What should I know about heart disease, diabetes, and high blood pressure? Blood pressure and heart disease  High blood pressure causes heart disease and increases the risk of stroke. This is more likely to develop in people who have high blood pressure readings, are of African descent, or are overweight.  Talk with your health care provider about your target blood pressure readings.  Have your blood pressure checked: ? Every 3-5 years if you are 55 years of age. ? Every year if you are 55 years old or older.  If you are between the ages of 65 and 75 and are a current or former smoker, ask your health care provider if you should have a one-time screening for abdominal aortic aneurysm (AAA). Diabetes Have regular diabetes screenings. This checks your fasting blood sugar level. Have the screening done:  Once every three years after age 55 if you are at a normal weight and have a low risk for diabetes.  More often and at a younger age if you are overweight or have a high risk for diabetes. What should I know about preventing infection? Hepatitis B If you have a higher risk for hepatitis B, you should be screened for this virus. Talk with your health care provider to find out if you are at risk for hepatitis B infection. Hepatitis C Blood testing is recommended for:  Everyone born from 1945 through 1965.  Anyone with known risk factors for hepatitis C. Sexually transmitted infections (STIs)  You should be screened each   year for STIs, including gonorrhea and chlamydia, if: ? You are sexually active and are younger than 55 years of age. ? You are older than 55 years of age and your health care provider tells you that you are at risk for this type of infection. ? Your sexual activity has changed since you were last screened, and you are at increased risk for chlamydia or gonorrhea. Ask your health care provider if you are at risk.  Ask your  health care provider about whether you are at high risk for HIV. Your health care provider may recommend a prescription medicine to help prevent HIV infection. If you choose to take medicine to prevent HIV, you should first get tested for HIV. You should then be tested every 3 months for as long as you are taking the medicine. Follow these instructions at home: Lifestyle  Do not use any products that contain nicotine or tobacco, such as cigarettes, e-cigarettes, and chewing tobacco. If you need help quitting, ask your health care provider.  Do not use street drugs.  Do not share needles.  Ask your health care provider for help if you need support or information about quitting drugs. Alcohol use  Do not drink alcohol if your health care provider tells you not to drink.  If you drink alcohol: ? Limit how much you have to 0-2 drinks a day. ? Be aware of how much alcohol is in your drink. In the [place] one drink equals one 12 oz bottle of beer (355 mL), one 5 oz glass of wine (148 mL), or one 1 oz glass of hard liquor (44 mL). General instructions  Schedule regular health, dental, and eye exams.  Stay current with your vaccines.  Tell your health care provider if: ? You often feel depressed. ? You have ever been abused or do not feel safe at home. Summary  Adopting a healthy lifestyle and getting preventive care are important in promoting health and wellness.  Follow your health care provider's instructions about healthy diet, exercising, and getting tested or screened for diseases.  Follow your health care provider's instructions on monitoring your cholesterol and blood pressure. This information is not intended to replace advice given to you by your health care provider. Make sure you discuss any questions you have with your health care provider. Document Revised: 04/09/2018 Document Reviewed: 04/09/2018 Elsevier Patient Education  2021 [employer] Inc[employer]  Preventive Care 40-64 Years  Old, Male Preventive care refers to lifestyle choices and visits with your health care provider that can promote health and wellness. This includes:  A yearly physical exam. This is also called an annual wellness visit.  Regular dental and eye exams.  Immunizations.  Screening for certain conditions.  Healthy lifestyle choices, such as: ? Eating a healthy diet. ? Getting regular exercise. ? Not using drugs or products that contain nicotine and tobacco. ? Limiting alcohol use. What can I expect for my preventive care visit? Physical exam Your health care provider will check your:  Height and weight. These may be used to calculate your BMI (body mass index). BMI is a measurement that tells if you are at a healthy weight.  Heart rate and blood pressure.  Body temperature.  Skin for abnormal spots. Counseling Your health care provider may ask you questions about your:  Past medical problems.  Family's medical history.  Alcohol, tobacco, and drug use.  Emotional well-being.  Home life and relationship well-being.  Sexual activity.  Diet, exercise, and sleep habits.    Work and work environment.  Access to firearms. What immunizations do I need? Vaccines are usually given at various ages, according to a schedule. Your health care provider will recommend vaccines for you based on your age, medical history, and lifestyle or other factors, such as travel or where you work.   What tests do I need? Blood tests  Lipid and cholesterol levels. These may be checked every 5 years, or more often if you are over 50 years old.  Hepatitis C test.  Hepatitis B test. Screening  Lung cancer screening. You may have this screening every year starting at age 55 if you have a 30-pack-year history of smoking and currently smoke or have quit within the past 15 years.  Prostate cancer screening. Recommendations will vary depending on your family history and other risks.  Genital exam  to check for testicular cancer or hernias.  Colorectal cancer screening. ? All adults should have this screening starting at age 50 and continuing until age 75. ? Your health care provider may recommend screening at age 45 if you are at increased risk. ? You will have tests every 1-10 years, depending on your results and the type of screening test.  Diabetes screening. ? This is done by checking your blood sugar (glucose) after you have not eaten for a while (fasting). ? You may have this done every 1-3 years.  STD (sexually transmitted disease) testing, if you are at risk. Follow these instructions at home: Eating and drinking  Eat a diet that includes fresh fruits and vegetables, whole grains, lean protein, and low-fat dairy products.  Take vitamin and mineral supplements as recommended by your health care provider.  Do not drink alcohol if your health care provider tells you not to drink.  If you drink alcohol: ? Limit how much you have to 0-2 drinks a day. ? Be aware of how much alcohol is in your drink. In the [place] one drink equals one 12 oz bottle of beer (355 mL), one 5 oz glass of wine (148 mL), or one 1 oz glass of hard liquor (44 mL).   Lifestyle  Take daily care of your teeth and gums. Brush your teeth every morning and night with fluoride toothpaste. Floss one time each day.  Stay active. Exercise for at least 30 minutes 5 or more days each week.  Do not use any products that contain nicotine or tobacco, such as cigarettes, e-cigarettes, and chewing tobacco. If you need help quitting, ask your health care provider.  Do not use drugs.  If you are sexually active, practice safe sex. Use a condom or other form of protection to prevent STIs (sexually transmitted infections).  If told by your health care provider, take low-dose aspirin daily starting at age 50.  Find healthy ways to cope with stress, such as: ? Meditation, yoga, or listening to  music. ? Journaling. ? Talking to a trusted person. ? Spending time with friends and family. Safety  Always wear your seat belt while driving or riding in a vehicle.  Do not drive: ? If you have been drinking alcohol. Do not ride with someone who has been drinking. ? When you are tired or distracted. ? While texting.  Wear a helmet and other protective equipment during sports activities.  If you have firearms in your house, make sure you follow all gun safety procedures. What's next?  Go to your health care provider once a year for an annual wellness visit.  Ask your health   care provider how often you should have your eyes and teeth checked.  Stay up to date on all vaccines. This information is not intended to replace advice given to you by your health care provider. Make sure you discuss any questions you have with your health care provider. Document Revised: 01/13/2019 Document Reviewed: 04/10/2018 Elsevier Patient Education  2021 [employer]  

## 2020-09-24 LAB — HEPATIC FUNCTION PANEL
AG Ratio: 1.3 (calc) (ref 1.0–2.5)
ALT: 21 U/L (ref 9–46)
AST: 22 U/L (ref 10–35)
Albumin: 4.1 g/dL (ref 3.6–5.1)
Alkaline phosphatase (APISO): 76 U/L (ref 35–144)
Bilirubin, Direct: 0.2 mg/dL (ref 0.0–0.2)
Globulin: 3.1 g/dL (calc) (ref 1.9–3.7)
Indirect Bilirubin: 0.5 mg/dL (calc) (ref 0.2–1.2)
Total Bilirubin: 0.7 mg/dL (ref 0.2–1.2)
Total Protein: 7.2 g/dL (ref 6.1–8.1)

## 2020-09-24 LAB — CBC
HCT: 39.7 % (ref 38.5–50.0)
Hemoglobin: 13.6 g/dL (ref 13.2–17.1)
MCH: 32.2 pg (ref 27.0–33.0)
MCHC: 34.3 g/dL (ref 32.0–36.0)
MCV: 93.9 fL (ref 80.0–100.0)
MPV: 9.5 fL (ref 7.5–12.5)
Platelets: 311 10*3/uL (ref 140–400)
RBC: 4.23 10*6/uL (ref 4.20–5.80)
RDW: 13.6 % (ref 11.0–15.0)
WBC: 6.2 10*3/uL (ref 3.8–10.8)

## 2020-09-24 LAB — BASIC METABOLIC PANEL
BUN: 14 mg/dL (ref 7–25)
CO2: 25 mmol/L (ref 20–32)
Calcium: 9.3 mg/dL (ref 8.6–10.3)
Chloride: 102 mmol/L (ref 98–110)
Creat: 1.12 mg/dL (ref 0.70–1.33)
Glucose, Bld: 100 mg/dL — ABNORMAL HIGH (ref 65–99)
Potassium: 3.8 mmol/L (ref 3.5–5.3)
Sodium: 136 mmol/L (ref 135–146)

## 2020-09-24 LAB — URINALYSIS, ROUTINE W REFLEX MICROSCOPIC
Bilirubin Urine: NEGATIVE
Glucose, UA: NEGATIVE
Hgb urine dipstick: NEGATIVE
Ketones, ur: NEGATIVE
Leukocytes,Ua: NEGATIVE
Nitrite: NEGATIVE
Protein, ur: NEGATIVE
Specific Gravity, Urine: 1.013 (ref 1.001–1.035)
pH: 6 (ref 5.0–8.0)

## 2020-09-24 LAB — LIPID PANEL
Cholesterol: 153 mg/dL (ref <200)
HDL: 50 mg/dL (ref >=40)
LDL Cholesterol (Calc): 87 mg/dL (calc)
Non-HDL Cholesterol (Calc): 103 mg/dL (calc) (ref <130)
Total CHOL/HDL Ratio: 3.1 (calc) (ref <5.0)
Triglycerides: 73 mg/dL (ref <150)

## 2020-09-24 LAB — URIC ACID: Uric Acid, Serum: 6.6 mg/dL (ref 4.0–8.0)

## 2020-09-24 LAB — PSA: PSA: 0.22 ng/mL (ref <=4.00)

## 2021-03-30 ENCOUNTER — Ambulatory Visit: Payer: BC Managed Care – PPO | Admitting: Family Medicine

## 2021-04-10 ENCOUNTER — Ambulatory Visit: Payer: BC Managed Care – PPO | Admitting: Family Medicine

## 2021-05-11 ENCOUNTER — Other Ambulatory Visit: Payer: Self-pay

## 2021-05-12 ENCOUNTER — Ambulatory Visit: Payer: BC Managed Care – PPO | Admitting: Family Medicine

## 2021-05-12 ENCOUNTER — Encounter: Payer: Self-pay | Admitting: Family Medicine

## 2021-05-12 VITALS — BP 122/68 | HR 76 | Temp 97.6°F | Ht 71.0 in | Wt 246.4 lb

## 2021-05-12 DIAGNOSIS — I10 Essential (primary) hypertension: Secondary | ICD-10-CM

## 2021-05-12 DIAGNOSIS — M109 Gout, unspecified: Secondary | ICD-10-CM

## 2021-05-12 MED ORDER — ALLOPURINOL 100 MG PO TABS: 200.0000 mg | ORAL_TABLET | Freq: Every day | ORAL | 3 refills | Status: DC

## 2021-05-12 MED ORDER — AMLODIPINE BESYLATE 10 MG PO TABS: ORAL_TABLET | ORAL | 2 refills | Status: DC

## 2021-05-12 MED ORDER — TELMISARTAN 80 MG PO TABS: ORAL_TABLET | ORAL | 2 refills | Status: DC

## 2021-05-12 NOTE — Progress Notes (Signed)
Established Patient Office Visit  Subjective:  Patient ID: Russell Franco, male    DOB: 06/22/1965  Age: 56 y.o. MRN: [id number]  CC:  Chief Complaint  Patient presents with   Follow-up    6 month follow up, no concerns.     HPI [name] presents for follow-up of hypertension and gout.  Blood pressure has been well controlled with telmisartan and amlodipine.  Since starting allopurinol he has not had a gouty attack.  Reminds me that he did have a colonoscopy 8 years ago secondary to hematochezia.  No polyps were found just an internal hemorrhoid that it hemorrhaged.  His wife quit her job and is not certain that she wants to return to the workforce.  Finances are tight at home.  Past Medical History:  Diagnosis Date   Hypertension     No past surgical history on file.  No family history on file.  Social History   Socioeconomic History   Marital status: Married    Spouse name: Not on file   Number of children: Not on file   Years of education: Not on file   Highest education level: Not on file  Occupational History   Not on file  Tobacco Use   Smoking status: Never   Smokeless tobacco: Never  Substance and Sexual Activity   Alcohol use: Never   Drug use: Not on file   Sexual activity: Not on file  Other Topics Concern   Not on file  Social History Narrative   Not on file   Social Determinants of Health   Financial Resource Strain: Not on file  Food Insecurity: Not on file  Transportation Needs: Not on file  Physical Activity: Not on file  Stress: Not on file  Social Connections: Not on file  Intimate Partner Violence: Not on file    Outpatient Medications Prior to Visit  Medication Sig Dispense Refill   colchicine 0.6 MG tablet Take 1 tablet (0.6 mg total) by mouth 2 (two) times daily. As needed for attacks 60 tablet 2   indomethacin (INDOCIN) 50 MG capsule TAKE 1 CAPSULE BY MOUTH THREE TIMES DAILY WITH FOOD AS NEEDED 60 capsule 0   allopurinol  (ZYLOPRIM) 100 MG tablet Take 2 tablets (200 mg total) by mouth daily. 180 tablet 3   amLODipine (NORVASC) 10 MG tablet TAKE 1 TABLET(10 MG) BY MOUTH DAILY 90 tablet 2   telmisartan (MICARDIS) 80 MG tablet TAKE 1 TABLET(80 MG) BY MOUTH DAILY 90 tablet 2   No facility-administered medications prior to visit.    Allergies  Allergen Reactions   Penicillin G Other (See Comments) and Rash    Hives   Penicillins Itching and Rash    ROS Review of Systems  Constitutional: Negative.   HENT: Negative.    Eyes:  Negative for photophobia and visual disturbance.  Respiratory: Negative.    Cardiovascular: Negative.   Gastrointestinal: Negative.  Negative for anal bleeding and blood in stool.  Endocrine: Negative for polyphagia and polyuria.  Genitourinary:  Negative for difficulty urinating, frequency and urgency.  Musculoskeletal:  Negative for arthralgias and myalgias.  Neurological:  Negative for weakness and light-headedness.  Psychiatric/Behavioral: Negative.       Objective:    Physical Exam Vitals and nursing note reviewed.  Constitutional:      General: He is not in acute distress.    Appearance: Normal appearance. He is not ill-appearing, toxic-appearing or diaphoretic.  HENT:     Head: Normocephalic and  atraumatic.     Right Ear: Tympanic membrane, ear canal and external ear normal.     Left Ear: Tympanic membrane, ear canal and external ear normal.     Mouth/Throat:     Mouth: Mucous membranes are moist.     Pharynx: Oropharynx is clear. No oropharyngeal exudate or posterior oropharyngeal erythema.  Eyes:     General: No scleral icterus.       Right eye: No discharge.        Left eye: No discharge.     Extraocular Movements: Extraocular movements intact.     Conjunctiva/sclera: Conjunctivae normal.     Pupils: Pupils are equal, round, and reactive to light.  Neck:     Vascular: No carotid bruit.  Cardiovascular:     Rate and Rhythm: Normal rate and regular rhythm.   Pulmonary:     Effort: Pulmonary effort is normal.     Breath sounds: Normal breath sounds.  Musculoskeletal:     Cervical back: No rigidity or tenderness.  Lymphadenopathy:     Cervical: No cervical adenopathy.  Skin:    General: Skin is warm and dry.  Neurological:     Mental Status: He is alert and oriented to person, place, and time.  Psychiatric:        Mood and Affect: Mood normal.        Behavior: Behavior normal.    BP 122/68 (BP Location: Left Arm, Patient Position: Sitting, Cuff Size: Normal)    Pulse 76    Temp 97.6 F (36.4 C) (Temporal)    Ht 5\' 11"  (1.803 m)    Wt 246 lb 6.4 oz (111.8 kg)    SpO2 98%    BMI 34.37 kg/m  Wt Readings from Last 3 Encounters:  05/12/21 246 lb 6.4 oz (111.8 kg)  09/23/20 238 lb 9.6 oz (108.2 kg)  02/18/20 232 lb (105.2 kg)     Health Maintenance Due  Topic Date Due   HIV Screening  Never done   Hepatitis C Screening  Never done   COLONOSCOPY (Pts 45-37yrs Insurance coverage will need to be confirmed)  Never done   Zoster Vaccines- Shingrix (1 of 2) Never done    There are no preventive care reminders to display for this patient.  Lab Results  Component Value Date   TSH 2.31 07/18/2017   Lab Results  Component Value Date   WBC 6.2 09/23/2020   HGB 13.6 09/23/2020   HCT 39.7 09/23/2020   MCV 93.9 09/23/2020   PLT 311 09/23/2020   Lab Results  Component Value Date   NA 136 09/23/2020   K 3.8 09/23/2020   CO2 25 09/23/2020   GLUCOSE 100 (H) 09/23/2020   BUN 14 09/23/2020   CREATININE 1.12 09/23/2020   BILITOT 0.7 09/23/2020   ALKPHOS 88 02/18/2020   AST 22 09/23/2020   ALT 21 09/23/2020   PROT 7.2 09/23/2020   ALBUMIN 4.3 02/18/2020   CALCIUM 9.3 09/23/2020   GFR 73.86 02/18/2020   Lab Results  Component Value Date   CHOL 153 09/23/2020   Lab Results  Component Value Date   HDL 50 09/23/2020   Lab Results  Component Value Date   LDLCALC 87 09/23/2020   Lab Results  Component Value Date   TRIG 73  09/23/2020   Lab Results  Component Value Date   CHOLHDL 3.1 09/23/2020   No results found for: HGBA1C    Assessment & Plan:  The 10-year ASCVD risk score (Arnett  DK, et al., 2019) is: 4.4%   Values used to calculate the score:     Age: 64 years     Sex: Male     Is Non-Hispanic African American: No     Diabetic: No     Tobacco smoker: No     Systolic Blood Pressure: 123XX123 mmHg     Is BP treated: Yes     HDL Cholesterol: 50 mg/dL     Total Cholesterol: 153 mg/dL  Problem List Items Addressed This Visit       Cardiovascular and Mediastinum   Essential hypertension   Relevant Medications   amLODipine (NORVASC) 10 MG tablet   telmisartan (MICARDIS) 80 MG tablet   Other Relevant Orders   Comprehensive metabolic panel   CBC     Other   Gout - Primary   Relevant Medications   allopurinol (ZYLOPRIM) 100 MG tablet   Other Relevant Orders   Comprehensive metabolic panel   Uric acid    Meds ordered this encounter  Medications   allopurinol (ZYLOPRIM) 100 MG tablet    Sig: Take 2 tablets (200 mg total) by mouth daily.    Dispense:  180 tablet    Refill:  3   amLODipine (NORVASC) 10 MG tablet    Sig: TAKE 1 TABLET(10 MG) BY MOUTH DAILY    Dispense:  90 tablet    Refill:  2   telmisartan (MICARDIS) 80 MG tablet    Sig: TAKE 1 TABLET(80 MG) BY MOUTH DAILY    Dispense:  90 tablet    Refill:  2    Follow-up: Return in about 6 months (around 11/09/2021).   Patient is planning on losing weight and restarting his exercise routine.  Happy to hear that he has not had no further gouty attacks upon lowering his uric acid advised that he keep some colchicine and Indocin on hand for as needed use.  We will send that in for him as needed.  Would like to hold off on the colonoscopy referral until perhaps later this year. Libby Maw, MD

## 2021-05-13 LAB — CBC
HCT: 44.1 % (ref 38.5–50.0)
Hemoglobin: 15 g/dL (ref 13.2–17.1)
MCH: 32.2 pg (ref 27.0–33.0)
MCHC: 34 g/dL (ref 32.0–36.0)
MCV: 94.6 fL (ref 80.0–100.0)
MPV: 9.3 fL (ref 7.5–12.5)
Platelets: 340 10*3/uL (ref 140–400)
RBC: 4.66 10*6/uL (ref 4.20–5.80)
RDW: 14.2 % (ref 11.0–15.0)
WBC: 6.8 10*3/uL (ref 3.8–10.8)

## 2021-05-13 LAB — COMPREHENSIVE METABOLIC PANEL
AG Ratio: 1.2 (calc) (ref 1.0–2.5)
ALT: 16 U/L (ref 9–46)
AST: 18 U/L (ref 10–35)
Albumin: 4.4 g/dL (ref 3.6–5.1)
Alkaline phosphatase (APISO): 80 U/L (ref 35–144)
BUN: 18 mg/dL (ref 7–25)
CO2: 28 mmol/L (ref 20–32)
Calcium: 9.7 mg/dL (ref 8.6–10.3)
Chloride: 102 mmol/L (ref 98–110)
Creat: 1.1 mg/dL (ref 0.70–1.30)
Globulin: 3.8 g/dL (calc) — ABNORMAL HIGH (ref 1.9–3.7)
Glucose, Bld: 97 mg/dL (ref 65–99)
Potassium: 4.3 mmol/L (ref 3.5–5.3)
Sodium: 137 mmol/L (ref 135–146)
Total Bilirubin: 0.7 mg/dL (ref 0.2–1.2)
Total Protein: 8.2 g/dL — ABNORMAL HIGH (ref 6.1–8.1)

## 2021-05-13 LAB — URIC ACID: Uric Acid, Serum: 6.8 mg/dL (ref 4.0–8.0)

## 2021-05-22 IMAGING — US US ABDOMEN COMPLETE
1 series · 14 of 25 positions shown · non-contrast
Comparison: None.

CLINICAL DATA: Elevated liver enzymes

EXAM:
ABDOMEN ULTRASOUND COMPLETE

[Series 1: us abdomen complete · 14 of 69 slices shown]
[im 1/69]
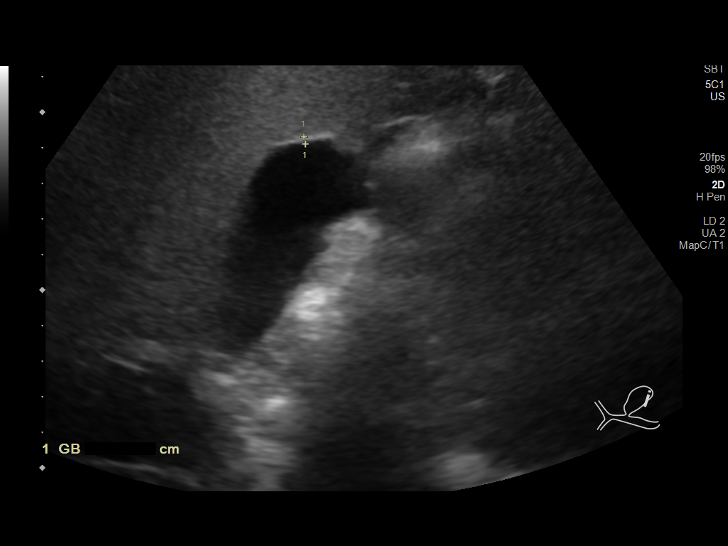
[im 6/69]
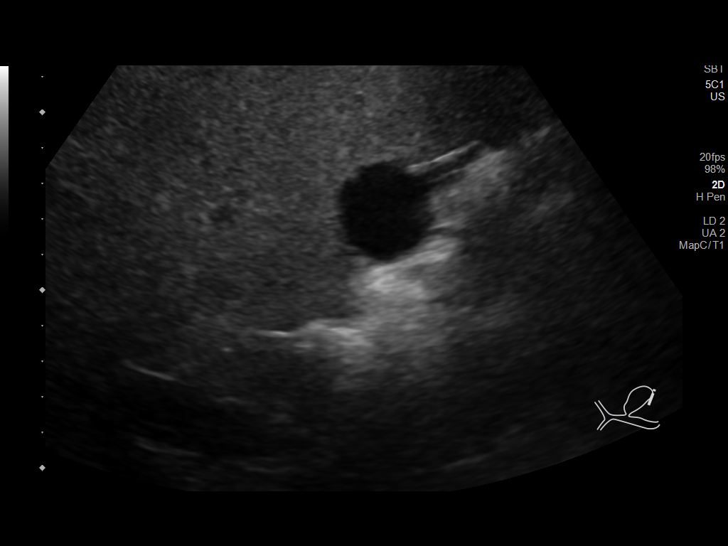
[im 12/69]
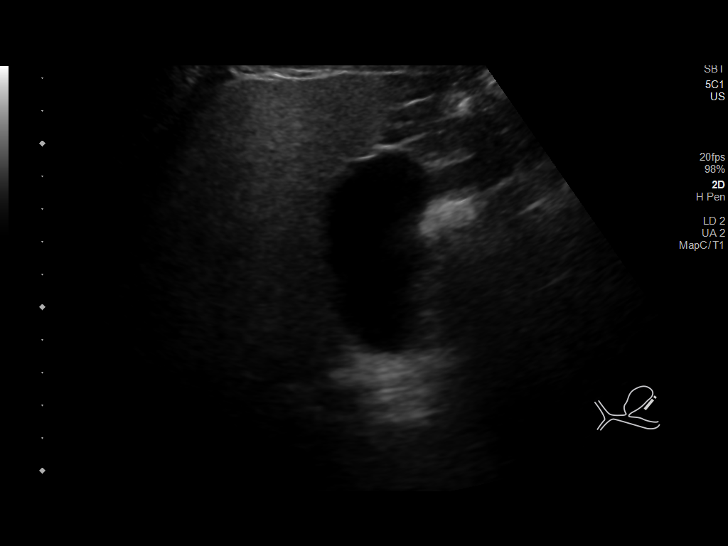
[im 18/69]
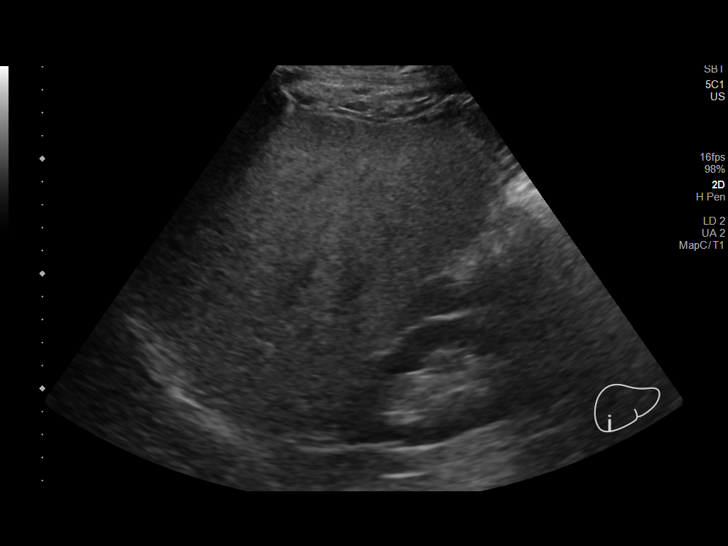
[im 23/69]
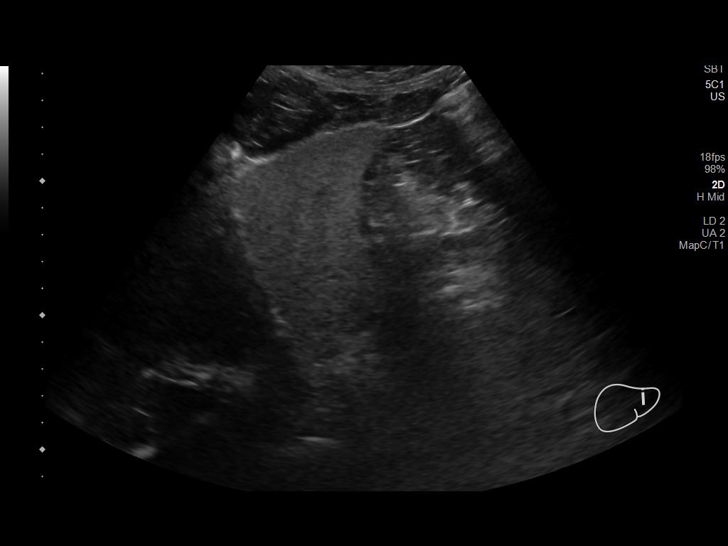
[im 26/69]
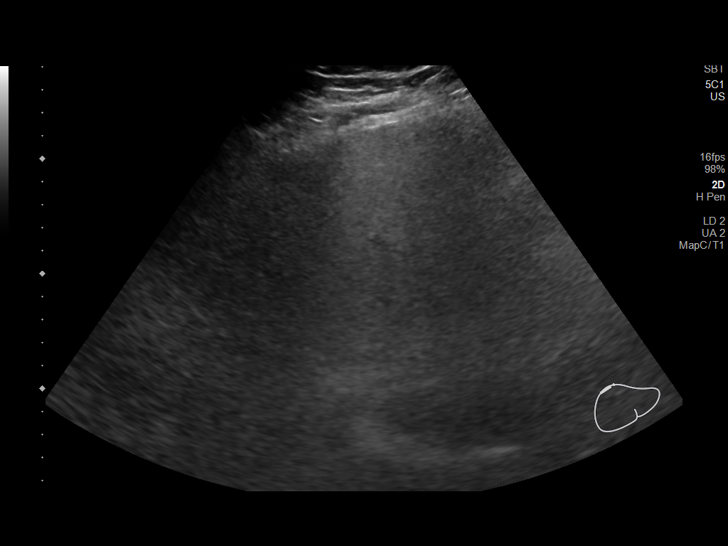
[im 32/69]
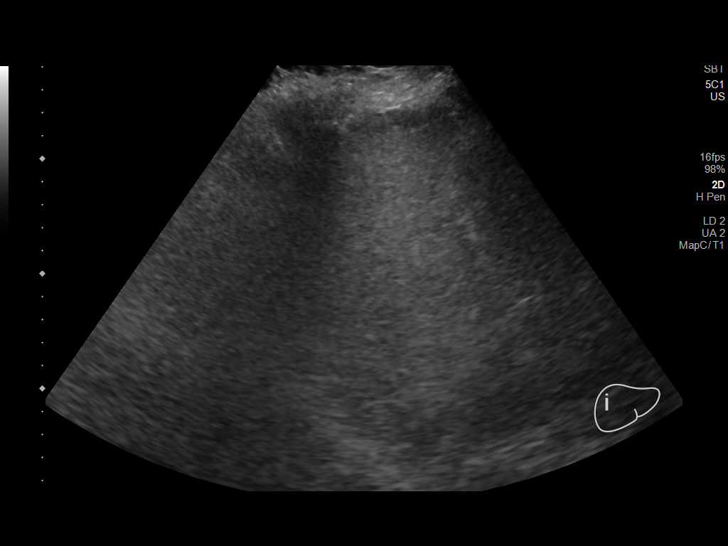
[im 37/69]
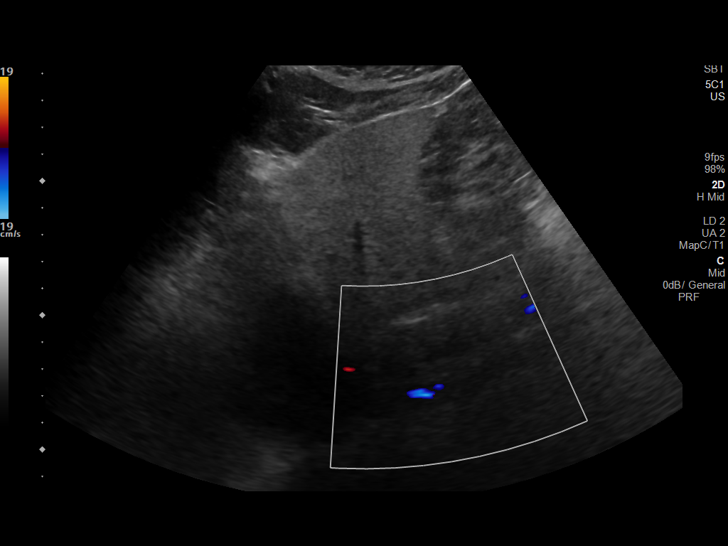
[im 43/69]
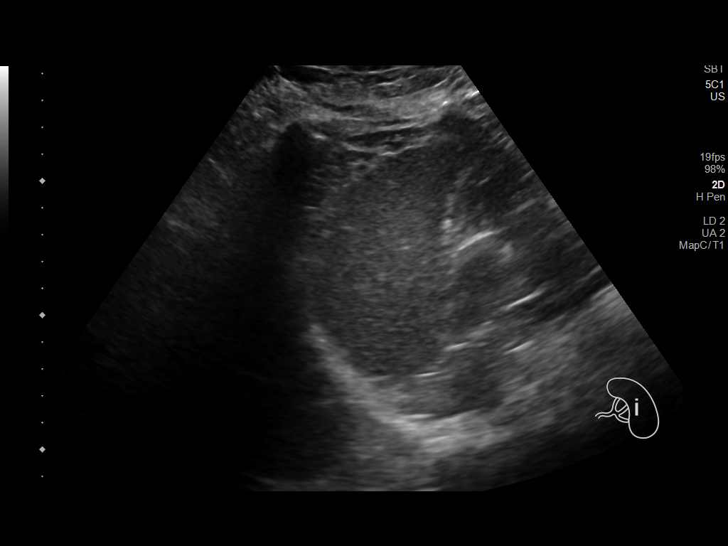
[im 46/69]
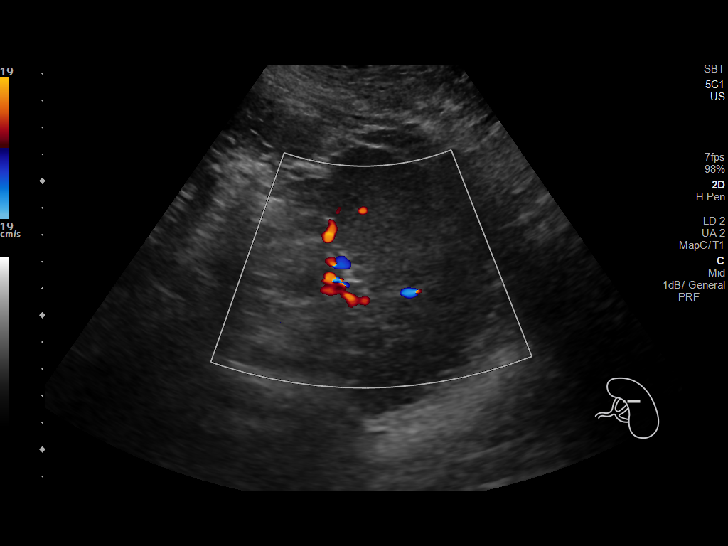
[im 52/69]
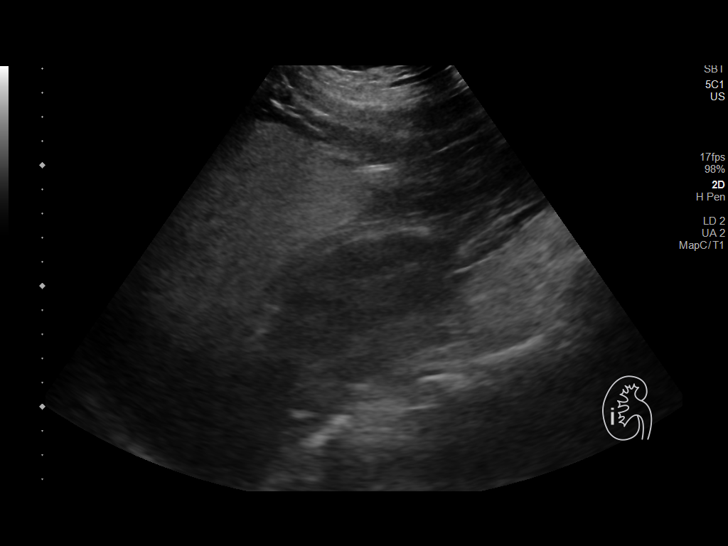
[im 57/69]
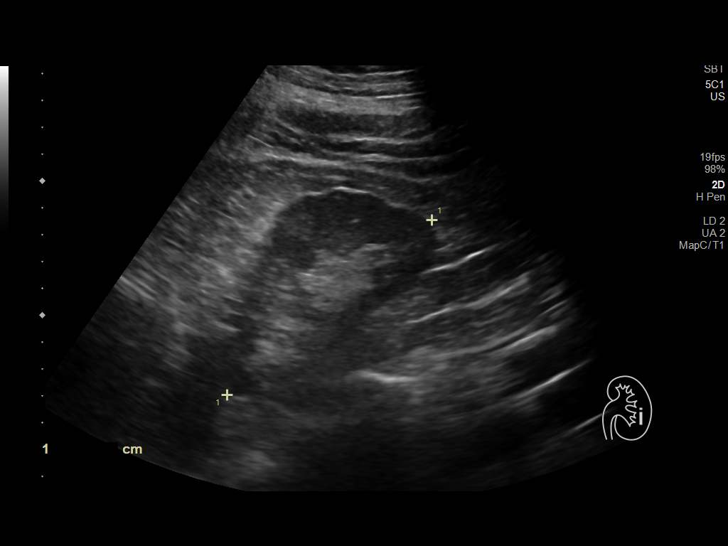
[im 63/69]
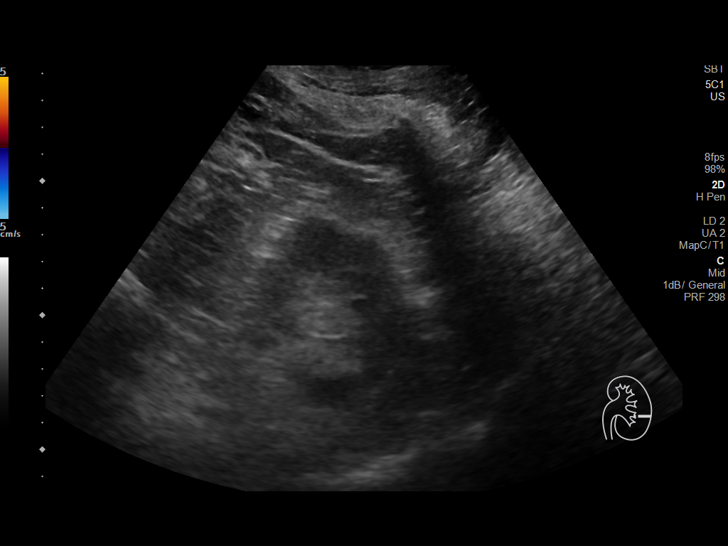
[im 69/69]
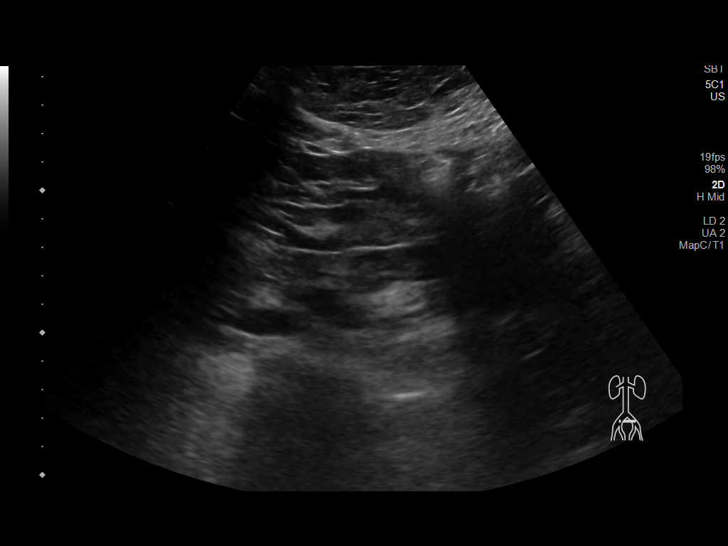

[14 of 25 positions shown; findings below may reference images not displayed]

FINDINGS: Gallbladder: No gallstones or wall thickening visualized. No
sonographic Murphy sign noted by sonographer.

Common bile duct: Diameter: 2 mm

Liver: No focal lesion identified. Increased parenchymal
echogenicity. Portal vein is patent on color Doppler imaging with
normal direction of blood flow towards the liver.

IVC: No abnormality visualized.

Pancreas: Visualized portion unremarkable.

Spleen: Size and appearance within normal limits.

Right Kidney: Length: 12 cm. Echogenicity within normal limits. No
mass or hydronephrosis visualized.

Left Kidney: Length: 10 cm. Echogenicity within normal limits. No
mass or hydronephrosis visualized.

Abdominal aorta: No aneurysm visualized.

Other findings: None.
IMPRESSION: Increased liver echogenicity is consistent with hepatic steatosis.

## 2021-07-12 ENCOUNTER — Encounter: Payer: Self-pay | Admitting: Family Medicine

## 2021-07-17 NOTE — Telephone Encounter (Signed)
Please advise message below patent has tick and pictures wanting to know if he could have virtual visit or would this need to be in office visit?  ?

## 2021-08-03 ENCOUNTER — Ambulatory Visit: Payer: BC Managed Care – PPO | Admitting: Family Medicine

## 2021-08-03 ENCOUNTER — Encounter: Payer: Self-pay | Admitting: Family Medicine

## 2021-08-03 VITALS — BP 118/68 | HR 74 | Temp 97.8°F | Ht 71.0 in | Wt 254.0 lb

## 2021-08-03 DIAGNOSIS — Z1211 Encounter for screening for malignant neoplasm of colon: Secondary | ICD-10-CM

## 2021-08-03 DIAGNOSIS — W57XXXA Bitten or stung by nonvenomous insect and other nonvenomous arthropods, initial encounter: Secondary | ICD-10-CM | POA: Diagnosis not present

## 2021-08-03 DIAGNOSIS — S40262A Insect bite (nonvenomous) of left shoulder, initial encounter: Secondary | ICD-10-CM

## 2021-08-03 MED ORDER — DOXYCYCLINE HYCLATE 100 MG PO TABS: 100.0000 mg | ORAL_TABLET | Freq: Two times a day (BID) | ORAL | 0 refills | Status: AC

## 2021-08-03 NOTE — Progress Notes (Signed)
? ?Established Patient Office Visit ? ?Subjective:  ?Patient ID: Russell Franco, male    DOB: [birth year]  Age: 56 y.o. MRN: [id number] ? ?CC:  ?Chief Complaint  ?Patient presents with  ? Insect Bite  ?  Tick bite few weeks ago not sure if he needs antibiotics.   ? ? ?HPI ?Russell Franco presents for follow-up of a tick bite 2 weeks ago.  There is a papule remaining.  He denies any type of expanding rash.  Denies a rash on his hands or feet, headache, fever or chills, arthralgias.  His mom had Lyme's disease that went undiagnosed for some time. ? ?Past Medical History:  ?Diagnosis Date  ? Hypertension   ? ? ?No past surgical history on file. ? ?No family history on file. ? ?Social History  ? ?Socioeconomic History  ? Marital status: Married  ?  Spouse name: Not on file  ? Number of children: Not on file  ? Years of education: Not on file  ? Highest education level: Not on file  ?Occupational History  ? Not on file  ?Tobacco Use  ? Smoking status: Never  ? Smokeless tobacco: Never  ?Substance and Sexual Activity  ? Alcohol use: Never  ? Drug use: Not on file  ? Sexual activity: Not on file  ?Other Topics Concern  ? Not on file  ?Social History Narrative  ? Not on file  ? ?Social Determinants of Health  ? ?Financial Resource Strain: Not on file  ?Food Insecurity: Not on file  ?Transportation Needs: Not on file  ?Physical Activity: Not on file  ?Stress: Not on file  ?Social Connections: Not on file  ?Intimate Partner Violence: Not on file  ? ? ?Outpatient Medications Prior to Visit  ?Medication Sig Dispense Refill  ? allopurinol (ZYLOPRIM) 100 MG tablet Take 2 tablets (200 mg total) by mouth daily. 180 tablet 3  ? amLODipine (NORVASC) 10 MG tablet TAKE 1 TABLET(10 MG) BY MOUTH DAILY 90 tablet 2  ? colchicine 0.6 MG tablet Take 1 tablet (0.6 mg total) by mouth 2 (two) times daily. As needed for attacks 60 tablet 2  ? indomethacin (INDOCIN) 50 MG capsule TAKE 1 CAPSULE BY MOUTH THREE TIMES DAILY WITH FOOD AS NEEDED 60  capsule 0  ? telmisartan (MICARDIS) 80 MG tablet TAKE 1 TABLET(80 MG) BY MOUTH DAILY 90 tablet 2  ? ?No facility-administered medications prior to visit.  ? ? ?Allergies  ?Allergen Reactions  ? Penicillin G Other (See Comments) and Rash  ?  Hives  ? Penicillins Itching and Rash  ? ? ?ROS ?Review of Systems  ?Constitutional:  Negative for chills, diaphoresis, fatigue, fever and unexpected weight change.  ?HENT: Negative.    ?Eyes:  Negative for photophobia and visual disturbance.  ?Respiratory:  Negative for chest tightness and shortness of breath.   ?Gastrointestinal: Negative.   ?Musculoskeletal:  Negative for arthralgias and myalgias.  ?Skin:  Positive for rash.  ?Neurological:  Negative for headaches.  ? ?  ?Objective:  ?  ?Physical Exam ?Vitals and nursing note reviewed.  ?Constitutional:   ?   General: He is not in acute distress. ?   Appearance: Normal appearance. He is not ill-appearing, toxic-appearing or diaphoretic.  ?HENT:  ?   Head: Normocephalic and atraumatic.  ?   Right Ear: External ear normal.  ?   Left Ear: External ear normal.  ?Eyes:  ?   General:     ?   Right eye: No discharge.     ?  Left eye: No discharge.  ?   Extraocular Movements: Extraocular movements intact.  ?   Conjunctiva/sclera: Conjunctivae normal.  ?Pulmonary:  ?   Effort: Pulmonary effort is normal.  ?Skin: ?   General: Skin is warm and dry.  ? ?    ?Neurological:  ?   Mental Status: He is alert and oriented to person, place, and time.  ?Psychiatric:     ?   Mood and Affect: Mood normal.     ?   Behavior: Behavior normal.  ? ? ?BP 118/68 (BP Location: Left Arm, Patient Position: Sitting, Cuff Size: Large)   Pulse 74   Temp 97.8 ?F (36.6 ?C) (Temporal)   Ht 5\' 11"  (1.803 m)   Wt 254 lb (115.2 kg)   SpO2 97%   BMI 35.43 kg/m?  ?Wt Readings from Last 3 Encounters:  ?08/03/21 254 lb (115.2 kg)  ?05/12/21 246 lb 6.4 oz (111.8 kg)  ?09/23/20 238 lb 9.6 oz (108.2 kg)  ? ? ? ?Health Maintenance Due  ?Topic Date Due  ? HIV Screening   Never done  ? Hepatitis C Screening  Never done  ? COLONOSCOPY (Pts 45-42yrs Insurance coverage will need to be confirmed)  08/22/2016  ? ? ?There are no preventive care reminders to display for this patient. ? ?Lab Results  ?Component Value Date  ? TSH 2.31 07/18/2017  ? ?Lab Results  ?Component Value Date  ? WBC 6.8 05/12/2021  ? HGB 15.0 05/12/2021  ? HCT 44.1 05/12/2021  ? MCV 94.6 05/12/2021  ? PLT 340 05/12/2021  ? ?Lab Results  ?Component Value Date  ? NA 137 05/12/2021  ? K 4.3 05/12/2021  ? CO2 28 05/12/2021  ? GLUCOSE 97 05/12/2021  ? BUN 18 05/12/2021  ? CREATININE 1.10 05/12/2021  ? BILITOT 0.7 05/12/2021  ? ALKPHOS 88 02/18/2020  ? AST 18 05/12/2021  ? ALT 16 05/12/2021  ? PROT 8.2 (H) 05/12/2021  ? ALBUMIN 4.3 02/18/2020  ? CALCIUM 9.7 05/12/2021  ? GFR 73.86 02/18/2020  ? ?Lab Results  ?Component Value Date  ? CHOL 153 09/23/2020  ? ?Lab Results  ?Component Value Date  ? HDL 50 09/23/2020  ? ?Lab Results  ?Component Value Date  ? LDLCALC 87 09/23/2020  ? ?Lab Results  ?Component Value Date  ? TRIG 73 09/23/2020  ? ?Lab Results  ?Component Value Date  ? CHOLHDL 3.1 09/23/2020  ? ?No results found for: HGBA1C ? ?  ?Assessment & Franco:  ? ?Problem List Items Addressed This Visit   ?None ?Visit Diagnoses   ? ? Screen for colon cancer    -  Primary  ? Relevant Orders  ? Ambulatory referral to Gastroenterology  ? Tick bite of left shoulder, initial encounter      ? Relevant Medications  ? doxycycline (VIBRA-TABS) 100 MG tablet  ? Other Relevant Orders  ? Lyme Disease Serology w/Reflex  ? ?  ? ? ?Meds ordered this encounter  ?Medications  ? doxycycline (VIBRA-TABS) 100 MG tablet  ?  Sig: Take 1 tablet (100 mg total) by mouth 2 (two) times daily for 10 days.  ?  Dispense:  20 tablet  ?  Refill:  0  ? ? ?Follow-up: Return if symptoms worsen or fail to improve.  ? ? ?09/25/2020, MD ?

## 2021-08-04 LAB — LYME DISEASE SEROLOGY W/REFLEX: Lyme Total Antibody EIA: NEGATIVE

## 2021-10-17 ENCOUNTER — Encounter: Payer: Self-pay | Admitting: Family Medicine

## 2021-11-10 ENCOUNTER — Ambulatory Visit: Payer: BC Managed Care – PPO | Admitting: Family Medicine

## 2021-11-14 ENCOUNTER — Ambulatory Visit: Payer: BC Managed Care – PPO | Admitting: Family Medicine

## 2021-11-14 ENCOUNTER — Encounter: Payer: Self-pay | Admitting: Family Medicine

## 2021-11-14 VITALS — BP 122/70 | HR 71 | Temp 97.1°F | Ht 71.0 in | Wt 243.6 lb

## 2021-11-14 DIAGNOSIS — I10 Essential (primary) hypertension: Secondary | ICD-10-CM

## 2021-11-14 DIAGNOSIS — Z23 Encounter for immunization: Secondary | ICD-10-CM

## 2021-11-14 DIAGNOSIS — M109 Gout, unspecified: Secondary | ICD-10-CM | POA: Diagnosis not present

## 2021-11-14 DIAGNOSIS — Z8739 Personal history of other diseases of the musculoskeletal system and connective tissue: Secondary | ICD-10-CM

## 2021-11-14 MED ORDER — AMLODIPINE BESYLATE 10 MG PO TABS: ORAL_TABLET | ORAL | 2 refills | Status: DC

## 2021-11-14 MED ORDER — TELMISARTAN 80 MG PO TABS: ORAL_TABLET | ORAL | 2 refills | Status: DC

## 2021-11-14 MED ORDER — ALLOPURINOL 100 MG PO TABS: 200.0000 mg | ORAL_TABLET | Freq: Every day | ORAL | 3 refills | Status: DC

## 2021-11-14 NOTE — Progress Notes (Signed)
Established Patient Office Visit  Subjective   Patient ID: Russell Franco, male    DOB: [birth year]  Age: 56 y.o. MRN: [id number]  Chief Complaint  Patient presents with   Follow-up    6 month follow up, no concerns.     HPI for follow-up of hypertension with history of gout.  Blood pressures been well controlled with the telmisartan and amlodipine.  Having no issues taking either medication.  Denies cough swelling in his lower extremities.  Gout is the thing of the past 2 g of allopurinol daily.  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    Review of Systems  Constitutional: Negative.   HENT: Negative.    Eyes:  Negative for blurred vision, discharge and redness.  Respiratory: Negative.    Cardiovascular: Negative.   Gastrointestinal:  Negative for abdominal pain.  Genitourinary: Negative.   Musculoskeletal: Negative.  Negative for myalgias.  Skin:  Negative for rash.  Neurological:  Negative for tingling, loss of consciousness and weakness.  Endo/Heme/Allergies:  Negative for polydipsia.      Objective:     BP 122/70 (BP Location: Left Arm, Patient Position: Sitting, Cuff Size: Normal)   Pulse 71   Temp (!) 97.1 F (36.2 C) (Temporal)   Ht 5\' 11"  (1.803 m)   Wt 243 lb 9.6 oz (110.5 kg)   SpO2 97%   BMI 33.98 kg/m  Wt Readings from Last 3 Encounters:  11/14/21 243 lb 9.6 oz (110.5 kg)  08/03/21 254 lb (115.2 kg)  05/12/21 246 lb 6.4 oz (111.8 kg)      Physical Exam Constitutional:      General: He is not in acute distress.    Appearance: Normal appearance. He is not ill-appearing, toxic-appearing or diaphoretic.  HENT:     Head: Normocephalic and atraumatic.     Right Ear: External ear normal.     Left Ear: External ear normal.  Eyes:     General: No scleral icterus.       Right eye: No discharge.        Left eye: No discharge.     Extraocular Movements: Extraocular movements intact.     Conjunctiva/sclera: Conjunctivae normal.  Cardiovascular:      Rate and Rhythm: Normal rate and regular rhythm.  Pulmonary:     Effort: Pulmonary effort is normal. No respiratory distress.     Breath sounds: Normal breath sounds.  Musculoskeletal:     Cervical back: No rigidity or tenderness.  Skin:    General: Skin is warm and dry.  Neurological:     Mental Status: He is alert and oriented to person, place, and time.  Psychiatric:        Mood and Affect: Mood normal.        Behavior: Behavior normal.      No results found for any visits on 11/14/21.    The 10-year ASCVD risk score (Arnett DK, et al., 2019) is: 4.4%    Assessment & Plan:   Problem List Items Addressed This Visit       Cardiovascular and Mediastinum   Essential hypertension - Primary   Relevant Medications   amLODipine (NORVASC) 10 MG tablet   telmisartan (MICARDIS) 80 MG tablet     Other   Gout   Relevant Medications   allopurinol (ZYLOPRIM) 100 MG tablet   History of gout   Other Visit Diagnoses     Need for Tdap vaccination  Relevant Orders   Tdap vaccine greater than or equal to 7yo IM       Return return in fall for physical exam.    [name], MD

## 2021-12-14 ENCOUNTER — Encounter: Payer: Self-pay | Admitting: Family Medicine

## 2023-02-05 ENCOUNTER — Encounter: Payer: BC Managed Care – PPO | Admitting: Family Medicine

## 2023-02-15 ENCOUNTER — Other Ambulatory Visit: Payer: Self-pay | Admitting: Family Medicine

## 2023-02-15 DIAGNOSIS — M109 Gout, unspecified: Secondary | ICD-10-CM

## 2023-02-15 DIAGNOSIS — I10 Essential (primary) hypertension: Secondary | ICD-10-CM

## 2023-03-07 ENCOUNTER — Ambulatory Visit: Payer: BC Managed Care – PPO | Admitting: Family Medicine

## 2023-03-07 ENCOUNTER — Encounter: Payer: Self-pay | Admitting: Family Medicine

## 2023-03-07 VITALS — BP 128/78 | HR 74 | Temp 97.8°F | Ht 71.0 in | Wt 246.6 lb

## 2023-03-07 DIAGNOSIS — Z125 Encounter for screening for malignant neoplasm of prostate: Secondary | ICD-10-CM | POA: Diagnosis not present

## 2023-03-07 DIAGNOSIS — I10 Essential (primary) hypertension: Secondary | ICD-10-CM | POA: Diagnosis not present

## 2023-03-07 DIAGNOSIS — Z1211 Encounter for screening for malignant neoplasm of colon: Secondary | ICD-10-CM

## 2023-03-07 DIAGNOSIS — M109 Gout, unspecified: Secondary | ICD-10-CM | POA: Diagnosis not present

## 2023-03-07 DIAGNOSIS — Z0000 Encounter for general adult medical examination without abnormal findings: Secondary | ICD-10-CM | POA: Diagnosis not present

## 2023-03-07 DIAGNOSIS — Z23 Encounter for immunization: Secondary | ICD-10-CM

## 2023-03-07 DIAGNOSIS — Z13220 Encounter for screening for lipoid disorders: Secondary | ICD-10-CM

## 2023-03-07 DIAGNOSIS — Z131 Encounter for screening for diabetes mellitus: Secondary | ICD-10-CM | POA: Diagnosis not present

## 2023-03-07 MED ORDER — ALLOPURINOL 100 MG PO TABS: 200.0000 mg | ORAL_TABLET | Freq: Every day | ORAL | 0 refills | Status: DC

## 2023-03-07 MED ORDER — TELMISARTAN 80 MG PO TABS: ORAL_TABLET | ORAL | 0 refills | Status: DC

## 2023-03-07 MED ORDER — AMLODIPINE BESYLATE 10 MG PO TABS: ORAL_TABLET | ORAL | 0 refills | Status: DC

## 2023-03-07 NOTE — Progress Notes (Signed)
Established Patient Office Visit   Subjective:  Patient ID: Russell Franco, male    DOB: Feb 27, 1966  Age: 57 y.o. MRN: [id number]  Chief Complaint  Patient presents with   Annual Exam    HPI Encounter Diagnoses  Name Primary?   Healthcare maintenance Yes   Need for influenza vaccination    Gout, unspecified cause, unspecified chronicity, unspecified site    Essential hypertension    Screening for diabetes mellitus    Screening for prostate cancer    Screening for cholesterol level    Screening for colon cancer    Presents for physical and follow-up of above.  Continues amlodipine and telmisartan for well-controlled hypertension.  He is having some lower extremity swelling associated with amlodipine.  Continues 200 mg of allopurinol for history of gout.  He is involved with the exercise class and has dietary advice offered through [employer].  Has regular dental care.  Work has been stressful.  46-hour weeks that requires days and evenings.   Review of Systems  Constitutional: Negative.   HENT: Negative.    Eyes:  Negative for blurred vision, discharge and redness.  Respiratory: Negative.    Cardiovascular: Negative.   Gastrointestinal:  Negative for abdominal pain.  Genitourinary: Negative.   Musculoskeletal: Negative.  Negative for myalgias.  Skin:  Negative for rash.  Neurological:  Negative for tingling, loss of consciousness and weakness.  Endo/Heme/Allergies:  Negative for polydipsia.     Current Outpatient Medications:    allopurinol (ZYLOPRIM) 100 MG tablet, Take 2 tablets (200 mg total) by mouth daily., Disp: 180 tablet, Rfl: 0   amLODipine (NORVASC) 10 MG tablet, TAKE 1 TABLET(10 MG) BY MOUTH DAILY, Disp: 90 tablet, Rfl: 0   telmisartan (MICARDIS) 80 MG tablet, TAKE 1 TABLET(80 MG) BY MOUTH DAILY, Disp: 90 tablet, Rfl: 0   Objective:     BP 128/78   Pulse 74   Temp 97.8 F (36.6 C) (Temporal)   Ht 5\' 11"  (1.803 m)   Wt 246 lb 9.6 oz (111.9 kg)   SpO2 98%    BMI 34.39 kg/m  Wt Readings from Last 3 Encounters:  03/07/23 246 lb 9.6 oz (111.9 kg)  11/14/21 243 lb 9.6 oz (110.5 kg)  08/03/21 254 lb (115.2 kg)      Physical Exam Constitutional:      General: He is not in acute distress.    Appearance: Normal appearance. He is not ill-appearing, toxic-appearing or diaphoretic.  HENT:     Head: Normocephalic and atraumatic.     Right Ear: Tympanic membrane, ear canal and external ear normal.     Left Ear: Tympanic membrane, ear canal and external ear normal.     Mouth/Throat:     Mouth: Mucous membranes are moist.     Pharynx: Oropharynx is clear. No oropharyngeal exudate or posterior oropharyngeal erythema.  Eyes:     General: No scleral icterus.       Right eye: No discharge.        Left eye: No discharge.     Extraocular Movements: Extraocular movements intact.     Conjunctiva/sclera: Conjunctivae normal.     Pupils: Pupils are equal, round, and reactive to light.  Cardiovascular:     Rate and Rhythm: Normal rate and regular rhythm.  Pulmonary:     Effort: Pulmonary effort is normal. No respiratory distress.     Breath sounds: Normal breath sounds.  Abdominal:     General: Bowel sounds are normal.  Tenderness: There is no abdominal tenderness. There is no guarding or rebound.  Musculoskeletal:     Cervical back: No rigidity or tenderness.     Right lower leg: Edema present.     Left lower leg: Edema present.  Skin:    General: Skin is warm and dry.  Neurological:     Mental Status: He is alert and oriented to person, place, and time.  Psychiatric:        Mood and Affect: Mood normal.        Behavior: Behavior normal.      No results found for any visits on 03/07/23.    The 10-year ASCVD risk score (Arnett DK, et al., 2019) is: 5.9%    Assessment & Plan:   Healthcare maintenance  Need for influenza vaccination -     Flu vaccine trivalent PF, 6mos and older(Flulaval,Afluria,Fluarix,Fluzone)  Gout,  unspecified cause, unspecified chronicity, unspecified site -     Allopurinol; Take 2 tablets (200 mg total) by mouth daily.  Dispense: 180 tablet; Refill: 0 -     Comprehensive metabolic panel -     Uric acid  Essential hypertension -     amLODIPine Besylate; TAKE 1 TABLET(10 MG) BY MOUTH DAILY  Dispense: 90 tablet; Refill: 0 -     Telmisartan; TAKE 1 TABLET(80 MG) BY MOUTH DAILY  Dispense: 90 tablet; Refill: 0 -     CBC -     Comprehensive metabolic panel -     Urinalysis, Routine w reflex microscopic  Screening for diabetes mellitus -     Comprehensive metabolic panel -     Hemoglobin A1c  Screening for prostate cancer -     PSA  Screening for cholesterol level -     Comprehensive metabolic panel -     Lipid panel  Screening for colon cancer -     Ambulatory referral to Gastroenterology    Return in about 6 months (around 09/04/2023).  Given information on health maintenance and disease prevention as well as exercising to lose weight.  Mild lower extremity edema probably secondary to amlodipine.  Is not bothering him that much.  We did discuss a medicine change but he would like to hold off for now.  Advised also to lower sodium in the diet.  [name], MD

## 2023-03-08 LAB — COMPREHENSIVE METABOLIC PANEL
ALT: 38 U/L (ref 0–53)
AST: 33 U/L (ref 0–37)
Albumin: 4.2 g/dL (ref 3.5–5.2)
Alkaline Phosphatase: 85 U/L (ref 39–117)
BUN: 17 mg/dL (ref 6–23)
CO2: 26 meq/L (ref 19–32)
Calcium: 9.4 mg/dL (ref 8.4–10.5)
Chloride: 103 meq/L (ref 96–112)
Creatinine, Ser: 1.14 mg/dL (ref 0.40–1.50)
GFR: 71.54 mL/min (ref >60.00)
Glucose, Bld: 100 mg/dL — ABNORMAL HIGH (ref 70–99)
Potassium: 3.8 meq/L (ref 3.5–5.1)
Sodium: 136 meq/L (ref 135–145)
Total Bilirubin: 0.7 mg/dL (ref 0.2–1.2)
Total Protein: 7.7 g/dL (ref 6.0–8.3)

## 2023-03-08 LAB — LIPID PANEL
Cholesterol: 173 mg/dL (ref 0–200)
HDL: 48.5 mg/dL (ref >39.00)
LDL Cholesterol: 110 mg/dL — ABNORMAL HIGH (ref 0–99)
NonHDL: 124.29
Total CHOL/HDL Ratio: 4
Triglycerides: 72 mg/dL (ref 0.0–149.0)
VLDL: 14.4 mg/dL (ref 0.0–40.0)

## 2023-03-08 LAB — CBC
HCT: 44.5 % (ref 39.0–52.0)
Hemoglobin: 14.7 g/dL (ref 13.0–17.0)
MCHC: 33.1 g/dL (ref 30.0–36.0)
MCV: 100.5 fL — ABNORMAL HIGH (ref 78.0–100.0)
Platelets: 316 10*3/uL (ref 150.0–400.0)
RBC: 4.42 Mil/uL (ref 4.22–5.81)
RDW: 14.7 % (ref 11.5–15.5)
WBC: 7 10*3/uL (ref 4.0–10.5)

## 2023-03-08 LAB — URIC ACID: Uric Acid, Serum: 6.2 mg/dL (ref 4.0–7.8)

## 2023-03-08 LAB — PSA: PSA: 0.53 ng/mL (ref 0.10–4.00)

## 2023-03-08 LAB — HEMOGLOBIN A1C: Hgb A1c MFr Bld: 6.1 % (ref 4.6–6.5)

## 2023-09-05 ENCOUNTER — Encounter: Payer: Self-pay | Admitting: Family Medicine

## 2023-09-05 ENCOUNTER — Ambulatory Visit: Payer: BC Managed Care – PPO | Admitting: Family Medicine

## 2023-09-05 VITALS — BP 118/76 | HR 80 | Temp 98.7°F | Ht 71.0 in | Wt 242.8 lb

## 2023-09-05 DIAGNOSIS — Z5320 Procedure and treatment not carried out because of patient's decision for unspecified reasons: Secondary | ICD-10-CM | POA: Diagnosis not present

## 2023-09-05 DIAGNOSIS — Z8739 Personal history of other diseases of the musculoskeletal system and connective tissue: Secondary | ICD-10-CM

## 2023-09-05 DIAGNOSIS — I10 Essential (primary) hypertension: Secondary | ICD-10-CM | POA: Diagnosis not present

## 2023-09-05 DIAGNOSIS — T887XXA Unspecified adverse effect of drug or medicament, initial encounter: Secondary | ICD-10-CM | POA: Insufficient documentation

## 2023-09-05 DIAGNOSIS — R7303 Prediabetes: Secondary | ICD-10-CM | POA: Diagnosis not present

## 2023-09-05 DIAGNOSIS — Z575 Occupational exposure to toxic agents in other industries: Secondary | ICD-10-CM

## 2023-09-05 MED ORDER — AMLODIPINE BESYLATE 10 MG PO TABS: ORAL_TABLET | ORAL | 2 refills | Status: AC

## 2023-09-05 MED ORDER — TELMISARTAN 80 MG PO TABS: ORAL_TABLET | ORAL | 2 refills | Status: AC

## 2023-09-05 MED ORDER — ALLOPURINOL 100 MG PO TABS: 200.0000 mg | ORAL_TABLET | Freq: Every day | ORAL | 2 refills | Status: AC

## 2023-09-05 NOTE — Progress Notes (Addendum)
 Established Patient Office Visit   Subjective:  Patient ID: Russell Franco, male    DOB: 12-Nov-1965  Age: 58 y.o. MRN: [id number]  Chief Complaint  Patient presents with   Medical Management of Chronic Issues    6 month follow up. Pt is fasting.    HPI Encounter Diagnoses  Name Primary?   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  Blood pressure well-controlled with amlodipine  and Micardis .  He is okay with the edema in his lower extremities from the higher dose of amlodipine .  He has no history of heart liver or kidney issues.  Finding time for exercise has been difficult for him secondary to his work schedule that includes days and age.  He has not had a gouty attack since starting allopurinol .  He is concerned about his elevated A1c.  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   Review of Systems  Constitutional: Negative.   HENT: Negative.    Eyes:  Negative for blurred vision, discharge and redness.  Respiratory: Negative.    Cardiovascular: Negative.   Gastrointestinal:  Negative for abdominal pain.  Genitourinary: Negative.   Musculoskeletal: Negative.  Negative for myalgias.  Skin:  Negative for rash.  Neurological:  Negative for tingling, loss of consciousness and weakness.  Endo/Heme/Allergies:  Negative for polydipsia.     Current Outpatient Medications:    allopurinol  (ZYLOPRIM ) 100 MG tablet, Take 2 tablets (200 mg total) by mouth daily., Disp: 180 tablet, Rfl: 2   amLODipine  (NORVASC ) 10 MG tablet, TAKE 1 TABLET(10 MG) BY MOUTH DAILY, Disp: 90 tablet, Rfl: 2   telmisartan  (MICARDIS ) 80 MG tablet, TAKE 1 TABLET(80 MG) BY MOUTH DAILY, Disp: 90 tablet, Rfl: 2   Objective:     BP 118/76 (Cuff Size: Large)   Pulse 80   Temp 98.7 F (37.1 C) (Temporal)   Ht 5\' 11"  (1.803 m)   Wt  242 lb 12.8 oz (110.1 kg)   SpO2 99%   BMI 33.86 kg/m  Wt Readings from Last 3 Encounters:  09/05/23 242 lb 12.8 oz (110.1 kg)  03/07/23 246 lb 9.6 oz (111.9 kg)  11/14/21 243 lb 9.6 oz (110.5 kg)      Physical Exam Constitutional:      General: He is not in acute distress.    Appearance: Normal appearance. He is not ill-appearing, toxic-appearing or diaphoretic.  HENT:     Head: Normocephalic and atraumatic.     Right Ear: External ear normal.     Left Ear: External ear normal.  Eyes:     General: No scleral icterus.       Right eye: No discharge.        Left eye: No discharge.     Extraocular Movements: Extraocular movements intact.     Conjunctiva/sclera: Conjunctivae normal.  Pulmonary:     Effort: Pulmonary effort is normal. No respiratory distress.  Musculoskeletal:     Right lower leg: Edema present.     Left lower leg: Edema present.       Legs:  Skin:    General: Skin is warm and dry.  Neurological:     Mental Status: He is alert and oriented to person, place, and time.  Psychiatric:        Mood and  Affect: Mood normal.        Behavior: Behavior normal.      No results found for any visits on 09/05/23.    The 10-year ASCVD risk score (Arnett DK, et al., 2019) is: 6%    Assessment & Plan:   Prediabetes -     Basic metabolic panel with GFR -     Hemoglobin A1c  Essential hypertension -     amLODIPine  Besylate; TAKE 1 TABLET(10 MG) BY MOUTH DAILY  Dispense: 90 tablet; Refill: 2 -     Telmisartan ; TAKE 1 TABLET(80 MG) BY MOUTH DAILY  Dispense: 90 tablet; Refill: 2 -     Basic metabolic panel with GFR  History of gout -     Allopurinol ; Take 2 tablets (200 mg total) by mouth daily.  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  Discussed using metformin  to prevent the onset of diabetes.  May start if A1c has increased.  Emphasized the importance of weight loss and exercise.  Information was given on exercising to lose weight.   Tonna Frederic, MD  5/12 addendum: A1c is stable at 6.1.  Will start metformin to help prevent onset of diabetes and possibly help with weight loss.

## 2023-09-06 LAB — HEMOGLOBIN A1C: Hgb A1c MFr Bld: 6.1 % (ref 4.6–6.5)

## 2023-09-06 LAB — BASIC METABOLIC PANEL WITH GFR
BUN: 13 mg/dL (ref 6–23)
CO2: 25 meq/L (ref 19–32)
Calcium: 9.2 mg/dL (ref 8.4–10.5)
Chloride: 103 meq/L (ref 96–112)
Creatinine, Ser: 1.03 mg/dL (ref 0.40–1.50)
GFR: 80.52 mL/min (ref >60.00)
Glucose, Bld: 87 mg/dL (ref 70–99)
Potassium: 3.9 meq/L (ref 3.5–5.1)
Sodium: 138 meq/L (ref 135–145)

## 2023-09-06 LAB — URIC ACID: Uric Acid, Serum: 6.8 mg/dL (ref 4.0–7.8)

## 2023-09-09 ENCOUNTER — Encounter: Payer: Self-pay | Admitting: Family Medicine

## 2023-09-09 MED ORDER — METFORMIN HCL ER 500 MG PO TB24: 500.0000 mg | ORAL_TABLET | Freq: Every evening | ORAL | 1 refills | Status: AC

## 2023-09-09 NOTE — Addendum Note (Signed)
 Addended by: Delene Feinstein on: 09/09/2023 11:35 AM   Modules accepted: Orders

## 2023-09-13 ENCOUNTER — Ambulatory Visit: Payer: Self-pay | Admitting: Family Medicine

## 2023-09-13 LAB — HEAVY METALS PROFILE, URINE
Arsenic, 24H Ur: 49 ug/L (ref <=80)
Lead, 24 hr urine: <10 ug/L (ref <80)
Mercury, 24H Ur: <4 ug/L (ref <=20)

## 2024-06-25 ENCOUNTER — Encounter: Admitting: Family Medicine
